# Patient Record
Sex: Male | Born: 1980 | Race: White | Hispanic: No | Marital: Married | Smoking: Never smoker
Health system: Southern US, Community
[De-identification: ages and names within clinical notes are randomized; demographics above are authoritative.]

## PROBLEM LIST (undated history)

## (undated) DIAGNOSIS — I10 Essential (primary) hypertension: Secondary | ICD-10-CM

## (undated) DIAGNOSIS — G43909 Migraine, unspecified, not intractable, without status migrainosus: Secondary | ICD-10-CM

## (undated) DIAGNOSIS — E119 Type 2 diabetes mellitus without complications: Secondary | ICD-10-CM

---
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
## 1999-01-28 ENCOUNTER — Emergency Department (HOSPITAL_COMMUNITY): Admission: EM | Admit: 1999-01-28 | Discharge: 1999-01-28 | Payer: Self-pay | Admitting: Emergency Medicine

## 2000-03-05 ENCOUNTER — Emergency Department (HOSPITAL_COMMUNITY): Admission: EM | Admit: 2000-03-05 | Discharge: 2000-03-05 | Payer: Self-pay | Admitting: Emergency Medicine

## 2000-03-07 ENCOUNTER — Emergency Department (HOSPITAL_COMMUNITY): Admission: EM | Admit: 2000-03-07 | Discharge: 2000-03-07 | Payer: Self-pay | Admitting: Emergency Medicine

## 2000-03-17 ENCOUNTER — Emergency Department (HOSPITAL_COMMUNITY): Admission: EM | Admit: 2000-03-17 | Discharge: 2000-03-17 | Payer: Self-pay

## 2000-04-11 ENCOUNTER — Emergency Department (HOSPITAL_COMMUNITY): Admission: EM | Admit: 2000-04-11 | Discharge: 2000-04-11 | Payer: Self-pay | Admitting: Emergency Medicine

## 2000-07-31 ENCOUNTER — Emergency Department (HOSPITAL_COMMUNITY): Admission: EM | Admit: 2000-07-31 | Discharge: 2000-07-31 | Payer: Self-pay

## 2000-07-31 ENCOUNTER — Encounter: Payer: Self-pay | Admitting: Emergency Medicine

## 2006-05-17 ENCOUNTER — Emergency Department (HOSPITAL_COMMUNITY): Admission: EM | Admit: 2006-05-17 | Discharge: 2006-05-17 | Payer: Self-pay | Admitting: Emergency Medicine

## 2007-05-15 IMAGING — CR DG SHOULDER 2+V*R*
3 series · 3 of 3 positions shown · non-contrast
Comparison: none

CLINICAL DATA: Multiple trauma. Right knee and right shoulder pain.
 CERVICAL SPINE WITH SWIMMERS - 6   VIEW:
 There is no evidence of cervical spine fracture or prevertebral soft tissue swelling.  Alignment is normal.  No other significant bone abnormalities are identified.

[w shoulder ap internal righ]
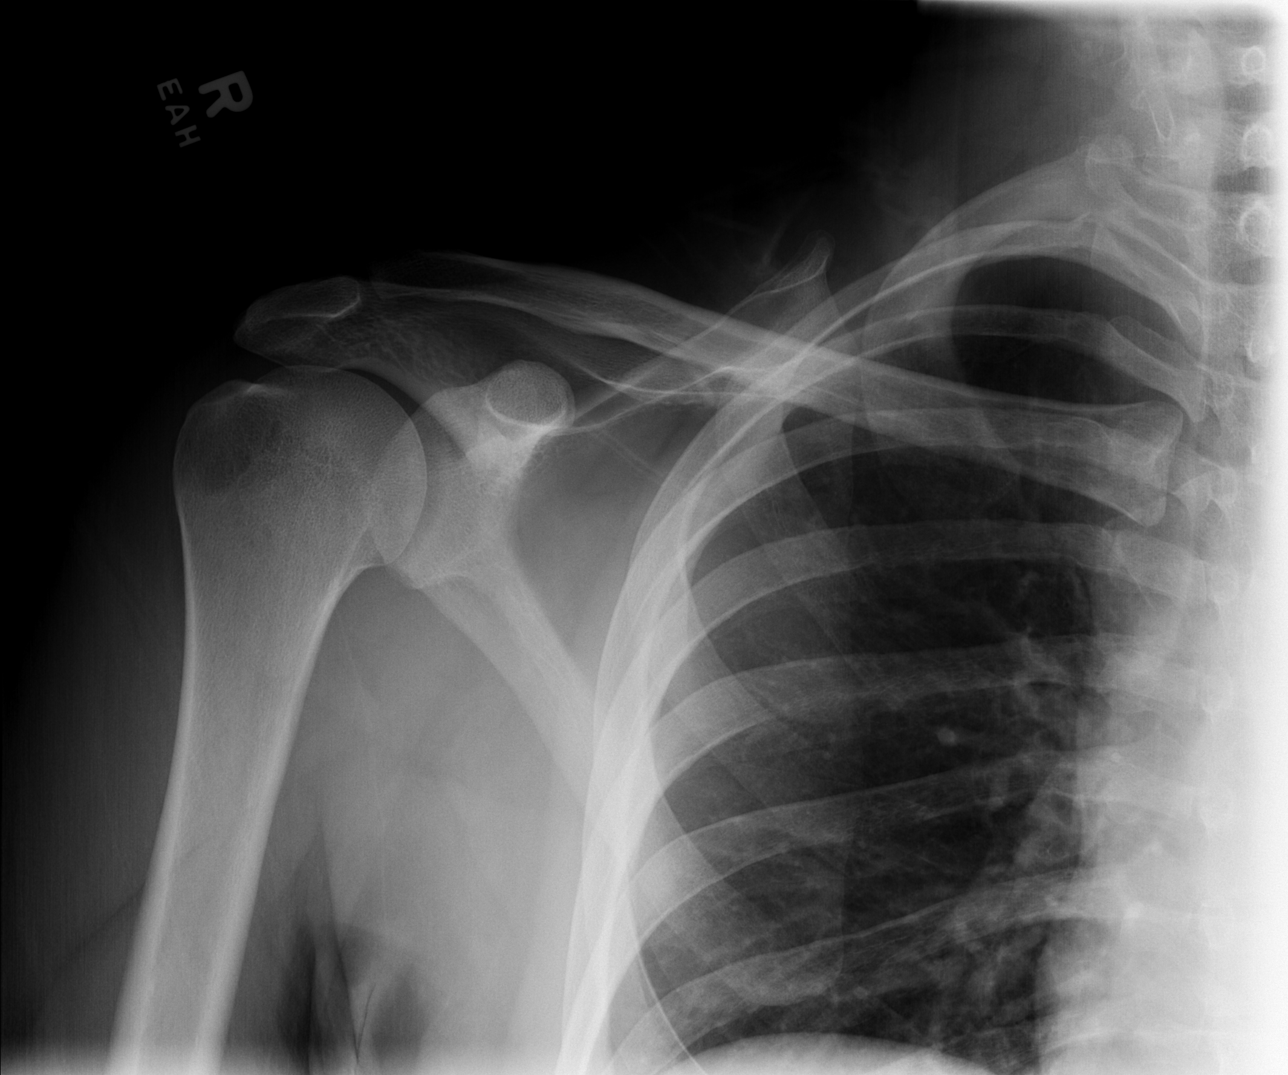

[w shoulder ap external righ]
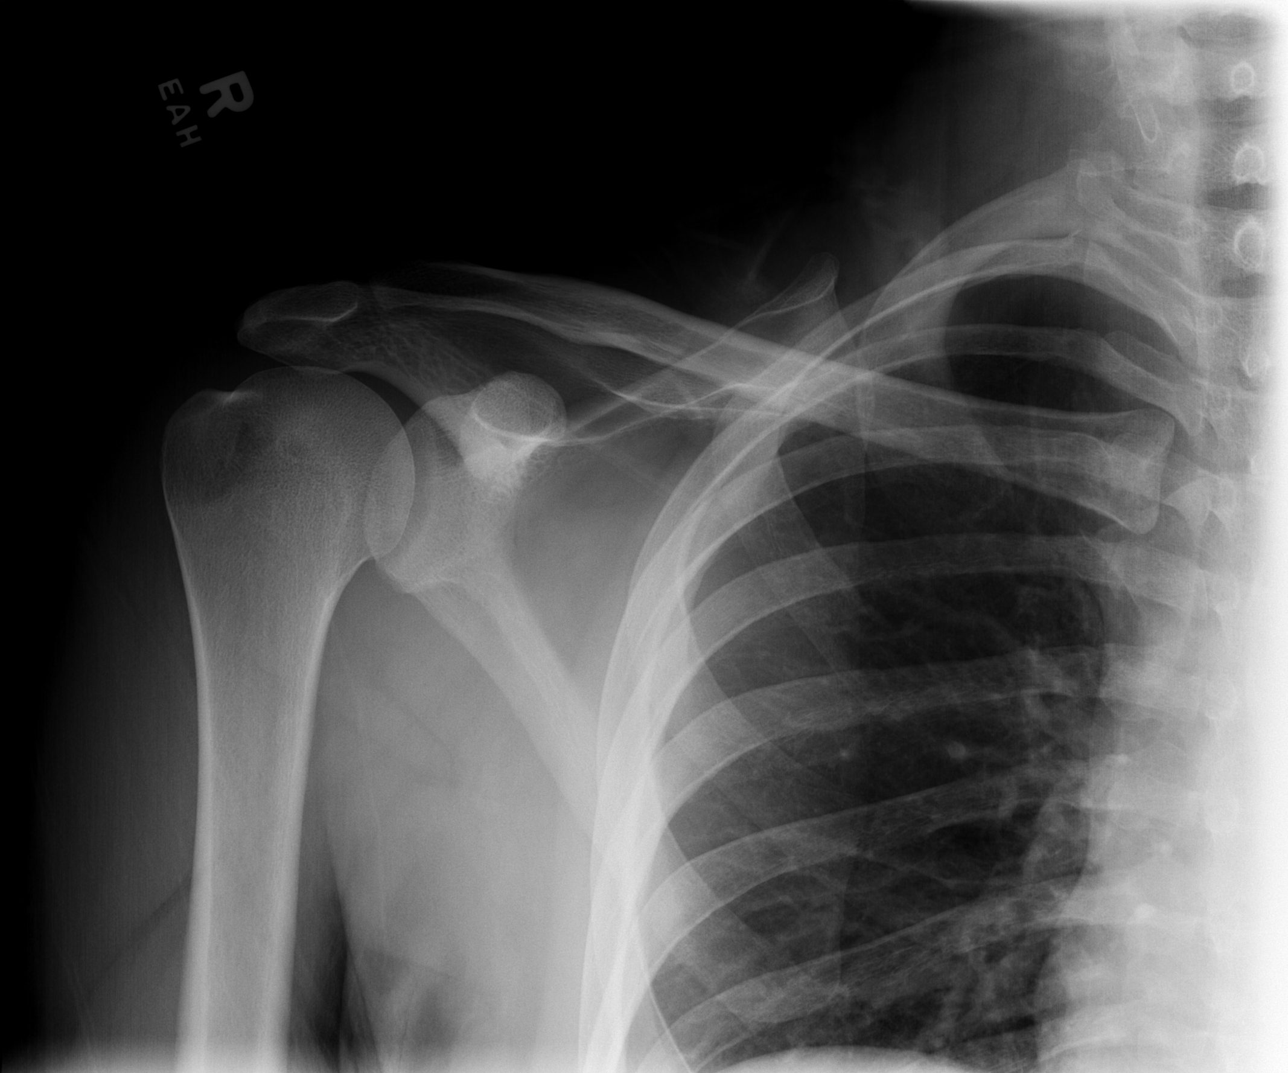

[w shoulder y view right]
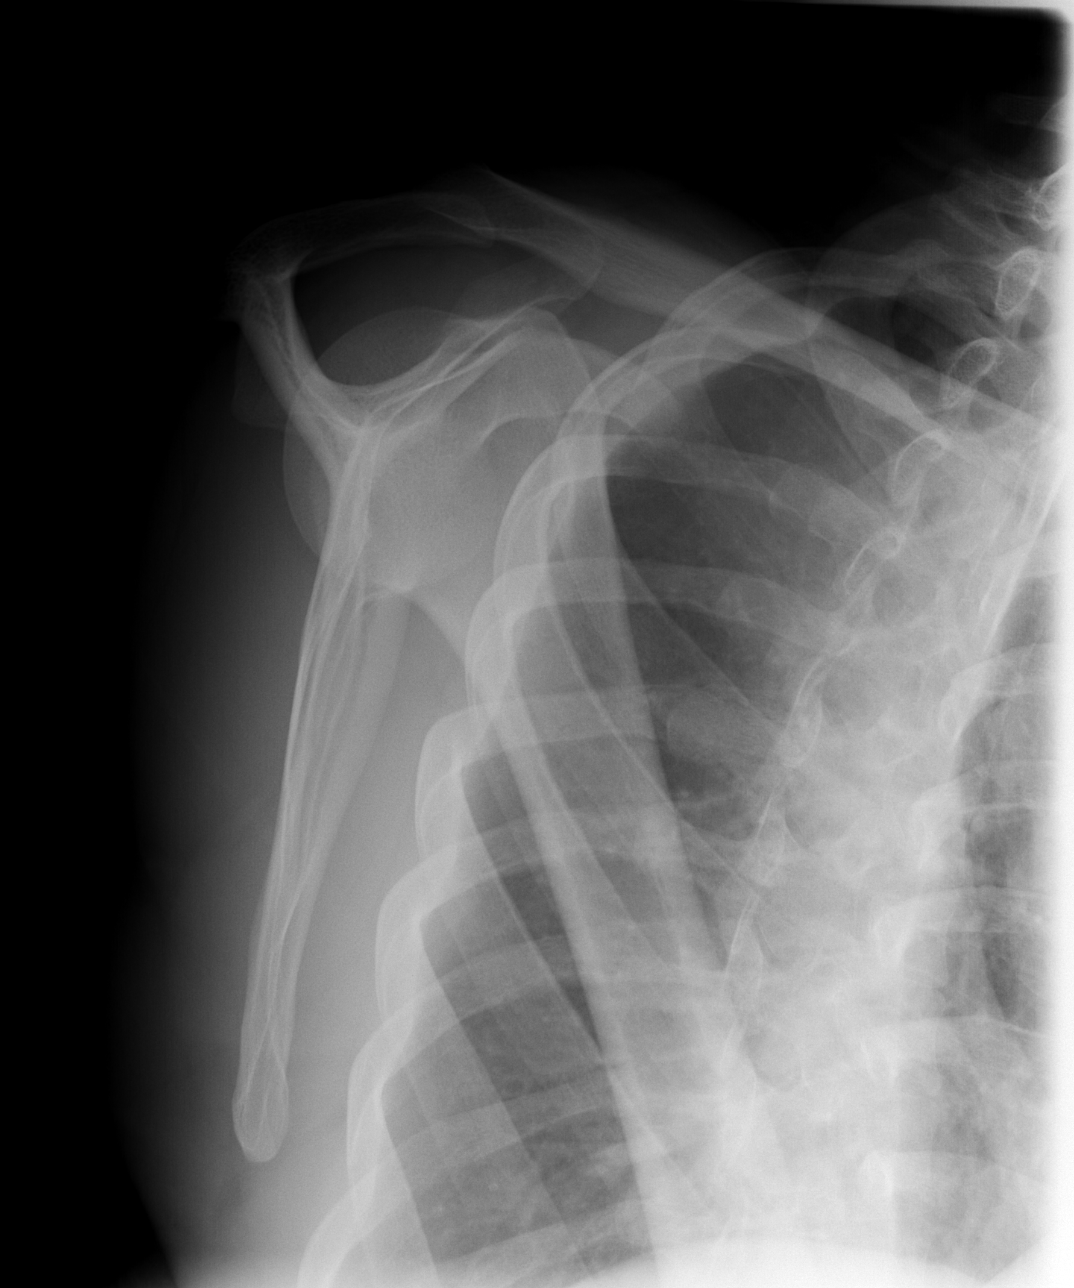

[3 of 3 positions shown; findings below may reference images not displayed]

IMPRESSION: Negative cervical spine radiographs.
 RIGHT KNEE - 4 VIEW:
 There is no evidence of fracture, dislocation, or joint effusion.  There is no evidence of arthropathy or other focal bone abnormality.  Soft tissues are unremarkable.
IMPRESSION: Negative.
 RIGHT SHOULDER - 3 VIEW:
 There is no evidence of fracture or dislocation.  There is no evidence of arthropathy or other focal bone abnormality.  Soft tissues are unremarkable.
IMPRESSION: Negative.

## 2008-07-26 ENCOUNTER — Emergency Department (HOSPITAL_COMMUNITY): Admission: EM | Admit: 2008-07-26 | Discharge: 2008-07-26 | Payer: Self-pay | Admitting: Emergency Medicine

## 2010-04-04 ENCOUNTER — Emergency Department (HOSPITAL_COMMUNITY): Admission: EM | Admit: 2010-04-04 | Discharge: 2010-04-05 | Payer: Self-pay | Admitting: Emergency Medicine

## 2010-08-01 ENCOUNTER — Ambulatory Visit: Payer: Self-pay | Admitting: Diagnostic Radiology

## 2010-08-01 ENCOUNTER — Emergency Department (HOSPITAL_BASED_OUTPATIENT_CLINIC_OR_DEPARTMENT_OTHER): Admission: EM | Admit: 2010-08-01 | Discharge: 2010-08-01 | Payer: Self-pay | Admitting: Emergency Medicine

## 2011-02-13 LAB — URINALYSIS, ROUTINE W REFLEX MICROSCOPIC
Ketones, ur: NEGATIVE mg/dL
Nitrite: NEGATIVE
Specific Gravity, Urine: 1.024 (ref 1.005–1.030)
pH: 5.5 (ref 5.0–8.0)

## 2011-02-13 LAB — COMPREHENSIVE METABOLIC PANEL
CO2: 25 mEq/L (ref 19–32)
Calcium: 8.9 mg/dL (ref 8.4–10.5)
Creatinine, Ser: 1 mg/dL (ref 0.4–1.5)
GFR calc non Af Amer: >60 mL/min (ref >60)
Glucose, Bld: 92 mg/dL (ref 70–99)
Total Protein: 7.4 g/dL (ref 6.0–8.3)

## 2011-02-13 LAB — CBC
HCT: 41.4 % (ref 39.0–52.0)
MCHC: 35.7 g/dL (ref 30.0–36.0)
MCV: 86.7 fL (ref 78.0–100.0)
Platelets: 138 10*3/uL — ABNORMAL LOW (ref 150–400)
RDW: 12.2 % (ref 11.5–15.5)

## 2011-02-13 LAB — DIFFERENTIAL
Lymphocytes Relative: 21 % (ref 12–46)
Lymphs Abs: 1.4 10*3/uL (ref 0.7–4.0)
Neutro Abs: 4.3 10*3/uL (ref 1.7–7.7)
Neutrophils Relative %: 67 % (ref 43–77)

## 2011-02-13 LAB — LIPASE, BLOOD: Lipase: 61 U/L (ref 23–300)

## 2011-05-15 ENCOUNTER — Emergency Department (INDEPENDENT_AMBULATORY_CARE_PROVIDER_SITE_OTHER): Payer: BC Managed Care – PPO

## 2011-05-15 ENCOUNTER — Emergency Department (HOSPITAL_BASED_OUTPATIENT_CLINIC_OR_DEPARTMENT_OTHER)
Admission: EM | Admit: 2011-05-15 | Discharge: 2011-05-15 | Disposition: A | Discharge status: Home / Self Care | Payer: Self-pay | Attending: Emergency Medicine | Admitting: Emergency Medicine

## 2011-05-15 DIAGNOSIS — M25579 Pain in unspecified ankle and joints of unspecified foot: Secondary | ICD-10-CM

## 2011-05-15 DIAGNOSIS — W19XXXA Unspecified fall, initial encounter: Secondary | ICD-10-CM

## 2011-05-15 DIAGNOSIS — S93409A Sprain of unspecified ligament of unspecified ankle, initial encounter: Secondary | ICD-10-CM | POA: Insufficient documentation

## 2011-05-19 ENCOUNTER — Encounter: Payer: Self-pay | Admitting: Family Medicine

## 2011-05-19 ENCOUNTER — Ambulatory Visit (INDEPENDENT_AMBULATORY_CARE_PROVIDER_SITE_OTHER): Payer: BC Managed Care – PPO | Admitting: Family Medicine

## 2011-05-19 VITALS — BP 145/82 | HR 99 | Temp 98.0°F | Ht 71.0 in | Wt 230.0 lb

## 2011-05-19 DIAGNOSIS — M25579 Pain in unspecified ankle and joints of unspecified foot: Secondary | ICD-10-CM

## 2011-05-19 DIAGNOSIS — M25571 Pain in right ankle and joints of right foot: Secondary | ICD-10-CM

## 2011-05-19 NOTE — Patient Instructions (Signed)
You have an ankle sprain. Ice the area for 15 minutes at a time, 3-4 times a day Take ibuprofen as you have been OR aleve 1-2 tabs twice a day with food for 1 week then as needed for pain, swelling. Elevate above the level of your heart when possible. Crutches if needed to help with walking. Bear weight when tolerated. Continue with laceup brace to help with stability as well. Come out of the brace twice a day to do Up/down and alphabet exercises 2-3 sets of each. Follow up with me in 2 weeks (1 1/2 if you're feeling great) for a recheck. I will mail off your FMLA paperwork.

## 2011-05-20 ENCOUNTER — Encounter: Payer: Self-pay | Admitting: Family Medicine

## 2011-05-20 DIAGNOSIS — M25571 Pain in right ankle and joints of right foot: Secondary | ICD-10-CM | POA: Insufficient documentation

## 2011-05-20 NOTE — Progress Notes (Signed)
  Subjective:    Patient ID: Dale Scott, male    DOB: 02-16-1981, 30 y.o.   MRN: [id number]  PCP: Dr. Donovan Kail  HPI 30 yo M here for right ankle injury.  Patient reports that on 6/14 he was carrying things down a set of stairs. Got to what he thought was last step (still had 1 step left though), inverted right ankle and fell to ground. + swelling but no bruising. Couldn't bear weight for first 5 minutes - pain went away but then worsened that night. Went to ED and had ankle x-rays that were negative. Given ASO and crutches which he has been using. Icing and using ibuprofen as well. Has h/o right ankle fracture 3 years ago that healed within 6 weeks and did not require surgery. 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  History reviewed. No pertinent past medical history.  No current outpatient prescriptions on file prior to visit.    History reviewed. No pertinent past surgical history.  Allergies  Allergen Reactions  . Sulfa Antibiotics     History   Social History  . Marital Status: Single    Spouse Name: N/A    Number of Children: N/A  . Years of Education: N/A   Occupational History  . Not on file.   Social History Main Topics  . Smoking status: Never Smoker   . Smokeless tobacco: Not on file  . Alcohol Use: Not on file  . Drug Use: Not on file  . Sexually Active: Not on file   Other Topics Concern  . Not on file   Social History Narrative  . No narrative on file    Family History  Problem Relation Age of Onset  . Adopted: Yes    BP 145/82  Pulse 99  Temp(Src) 98 F (36.7 C) (Oral)  Ht 5\' 11"  (1.803 m)  Wt 230 lb (104.327 kg)  BMI 32.08 kg/m2  Review of Systems See HPI above.    Objective:   Physical Exam Gen: NAD R ankle: Mod swelling throughout ankle but more laterally.  No bruising, erythema, skin breakdown. TTP at ATFL.  No TTP fibular  head, base 5th MT, navicular, posterior aspects of medial/lateral malleoli, elsewhere about foot or ankle. Mild limitation in ROM all planes. Strength 5/5 all motions. Negative syndesmotic compression, thompsons 1+ ant drawer and talar tilt (pain with tilt). NVI distally.     Assessment & Plan:  1. Right ankle sprain - initial radiographs negative and patient's pain is directly over ATFL.  Likely a grade 2 sprain that will take approximately 4 weeks to heal but in some instances can take as long as 6 weeks.  Will follow him clinically and reassess at 2 week intervals.  Start easy ROM exercises, icing, nsaids, elevation.  Use ASO for support and crutches as needed.  Weight bearing as tolerated.

## 2011-05-20 NOTE — Assessment & Plan Note (Signed)
initial radiographs negative and patient's pain is directly over ATFL. Likely a grade 2 sprain that will take approximately 4 weeks to heal but in some instances can take as long as 6 weeks. Will follow him clinically and reassess at 2 week intervals. Start easy ROM exercises, icing, nsaids, elevation. Use ASO for support and crutches as needed. Weight bearing as tolerated.

## 2011-06-02 ENCOUNTER — Encounter: Payer: Self-pay | Admitting: Family Medicine

## 2011-06-02 ENCOUNTER — Ambulatory Visit (INDEPENDENT_AMBULATORY_CARE_PROVIDER_SITE_OTHER): Payer: BC Managed Care – PPO | Admitting: Family Medicine

## 2011-06-02 VITALS — BP 134/85 | HR 78 | Temp 97.7°F | Ht 70.0 in | Wt 225.0 lb

## 2011-06-02 DIAGNOSIS — M25571 Pain in right ankle and joints of right foot: Secondary | ICD-10-CM

## 2011-06-02 DIAGNOSIS — M25579 Pain in unspecified ankle and joints of unspecified foot: Secondary | ICD-10-CM

## 2011-06-02 NOTE — Patient Instructions (Signed)
Continue to ice the area for 15 minutes at a time, 3-4 times a day as needed now Tylenol as needed for pain. Continue with laceup brace to help with stability when up and walking (in 2 weeks I'll likely only have you use this when at work). Start theraband exercises 3 sets of 10 each direction once daily, cone touches 3 sets of 15 once a day - can add a pillow under your foot if these become too easy. Follow up with me in 2 weeks for a recheck - out of work still until then.

## 2011-06-02 NOTE — Progress Notes (Signed)
  Subjective:    Patient ID: Dale Scott, male    DOB: Oct 24, 1981, 30 y.o.   MRN: [id number]  PCP: Dr. Donovan Kail  HPI  30 yo M here for 2 week f/u right ankle sprain.  6/18: Patient reports that on 6/14 he was carrying things down a set of stairs. Got to what he thought was last step (still had 1 step left though), inverted right ankle and fell to ground. + swelling but no bruising. Couldn't bear weight for first 5 minutes - pain went away but then worsened that night. Went to ED and had ankle x-rays that were negative. Given ASO and crutches which he has been using. Icing and using ibuprofen as well. Has h/o right ankle fracture 3 years ago that healed within 6 weeks and did not require surgery. 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  Today: Patient reports he is much better but still has good and bad days Using ASO when up and walking around. Icing, taking tylenol. Swelling is improved. Out of work 2/2 injury. Doing ROM and alphabet exercises.  History reviewed. No pertinent past medical history.  No current outpatient prescriptions on file prior to visit.    History reviewed. No pertinent past surgical history.  Allergies  Allergen Reactions  . Sulfa Antibiotics     History   Social History  . Marital Status: Single    Spouse Name: N/A    Number of Children: N/A  . Years of Education: N/A   Occupational History  . Not on file.   Social History Main Topics  . Smoking status: Never Smoker   . Smokeless tobacco: Not on file  . Alcohol Use: Not on file  . Drug Use: Not on file  . Sexually Active: Not on file   Other Topics Concern  . Not on file   Social History Narrative  . No narrative on file    Family History  Problem Relation Age of Onset  . Adopted: Yes    BP 134/85  Pulse 78  Temp(Src) 97.7 F (36.5 C) (Oral)  Ht 5\' 10"  (1.778 m)  Wt 225 lb  (102.059 kg)  BMI 32.28 kg/m2  Review of Systems  See HPI above.    Objective:   Physical Exam  Gen: NAD R ankle: Minimal swelling lateral ankle.  No bruising, erythema, skin breakdown. Mild TTP at ATFL.  No TTP fibular head, base 5th MT, navicular, posterior aspects of medial/lateral malleoli, elsewhere about foot or ankle. FROM all planes. Strength 5/5 all motions. Negative syndesmotic compression, thompsons 1+ ant drawer (mild pain) and trace talar tilt (no pain). NVI distally.     Assessment & Plan:  1. Right ankle sprain - patient has improved - noted laxity and pain still a 3/10.  Continue with ASO when ambulatory.  Start theraband exercises and balance exercises (demonstrated and handout provided).  Icing, tylenol as needed.  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

## 2011-06-02 NOTE — Assessment & Plan Note (Signed)
Right ankle sprain - patient has improved - noted laxity and pain still a 3/10.  Continue with ASO when ambulatory.  Start theraband exercises and balance exercises (demonstrated and handout provided).  Icing, tylenol as needed.  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

## 2011-06-16 ENCOUNTER — Encounter: Payer: Self-pay | Admitting: Family Medicine

## 2011-06-16 ENCOUNTER — Ambulatory Visit (INDEPENDENT_AMBULATORY_CARE_PROVIDER_SITE_OTHER): Payer: BC Managed Care – PPO | Admitting: Family Medicine

## 2011-06-16 VITALS — BP 135/87 | HR 74 | Ht 71.0 in | Wt 236.4 lb

## 2011-06-16 DIAGNOSIS — M25579 Pain in unspecified ankle and joints of unspecified foot: Secondary | ICD-10-CM

## 2011-06-16 DIAGNOSIS — M25571 Pain in right ankle and joints of right foot: Secondary | ICD-10-CM

## 2011-06-16 NOTE — Progress Notes (Signed)
  Subjective:    Patient ID: Dale Scott, male    DOB: 02/15/1981, 30 y.o.   MRN: [id number]  PCP: Dr. Donovan Kail  HPI  30 yo M here for 2 week f/u right ankle sprain.  6/18: Patient reports that on 6/14 he was carrying things down a set of stairs. Got to what he thought was last step (still had 1 step left though), inverted right ankle and fell to ground. + swelling but no bruising. Couldn't bear weight for first 5 minutes - pain went away but then worsened that night. Went to ED and had ankle x-rays that were negative. Given ASO and crutches which he has been using. Icing and using ibuprofen as well. Has h/o right ankle fracture 3 years ago that healed within 6 weeks and did not require surgery. 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  7/2: Patient reports he is much better but still has good and bad days Using ASO when up and walking around. Icing, taking tylenol. Swelling is improved. Out of work 2/2 injury. Doing ROM and alphabet exercises.  7/16: Patient significantly better without complaints. Still wears the ASO when walking. Not requiring icing, tylenol any longer. Ready to go back to work and feels safe doing so. Doing theraband exercises at home.  History reviewed. No pertinent past medical history.  No current outpatient prescriptions on file prior to visit.    History reviewed. No pertinent past surgical history.  Allergies  Allergen Reactions  . Sulfa Antibiotics     History   Social History  . Marital Status: Single    Spouse Name: N/A    Number of Children: N/A  . Years of Education: N/A   Occupational History  . Not on file.   Social History Main Topics  . Smoking status: Never Smoker   . Smokeless tobacco: Not on file  . Alcohol Use: Not on file  . Drug Use: Not on file  . Sexually Active: Not on file   Other Topics Concern  . Not on file     Social History Narrative  . No narrative on file    Family History  Problem Relation Age of Onset  . Adopted: Yes    BP 135/87  Pulse 74  Ht 5\' 11"  (1.803 m)  Wt 236 lb 6.4 oz (107.23 kg)  BMI 32.97 kg/m2  Review of Systems  See HPI above.    Objective:   Physical Exam  Gen: NAD R ankle: No swelling lateral ankle.  No bruising, erythema, skin breakdown. No TTP at ATFL.  No TTP fibular head, base 5th MT, navicular, posterior aspects of medial/lateral malleoli, elsewhere about foot or ankle. FROM all planes. Strength 5/5 all motions. Negative syndesmotic compression, thompsons Trace ant drawer (no pain) and trace talar tilt (no pain). NVI distally.     Assessment & Plan:  1. Right ankle sprain - patient has significantly improved - trace ant drawer and talar tilt.  Pain resolved.  Return to work though advised wearing ASO for another 2 weeks and doing home exercises for another 2 months (out to 3 months from injury).  F/u prn.

## 2011-06-16 NOTE — Assessment & Plan Note (Signed)
Right ankle sprain - patient has significantly improved - trace ant drawer and talar tilt.  Pain resolved.  Return to work though advised wearing ASO for another 2 weeks and doing home exercises for another 2 months (out to 3 months from injury).  F/u prn.

## 2012-05-12 IMAGING — CR DG ANKLE COMPLETE 3+V*R*
3 series · 3 of 3 positions shown · non-contrast
Comparison: None.

CLINICAL DATA: 29-year-old male status post fall with pain.

RIGHT ANKLE - COMPLETE 3+ VIEW

[t ankle joint ap right]
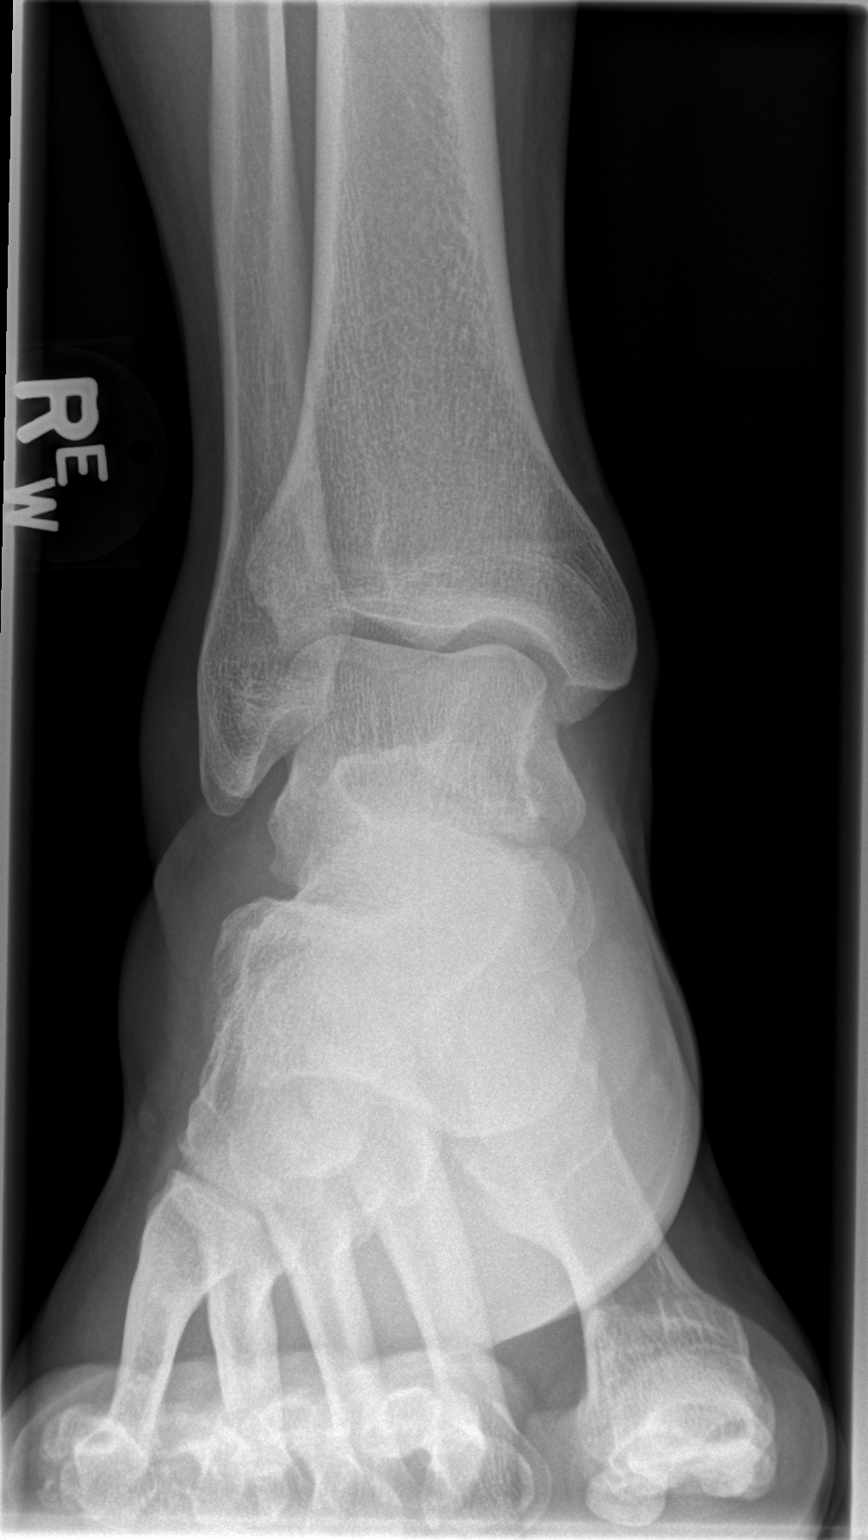

[t ankle joint oblique right]
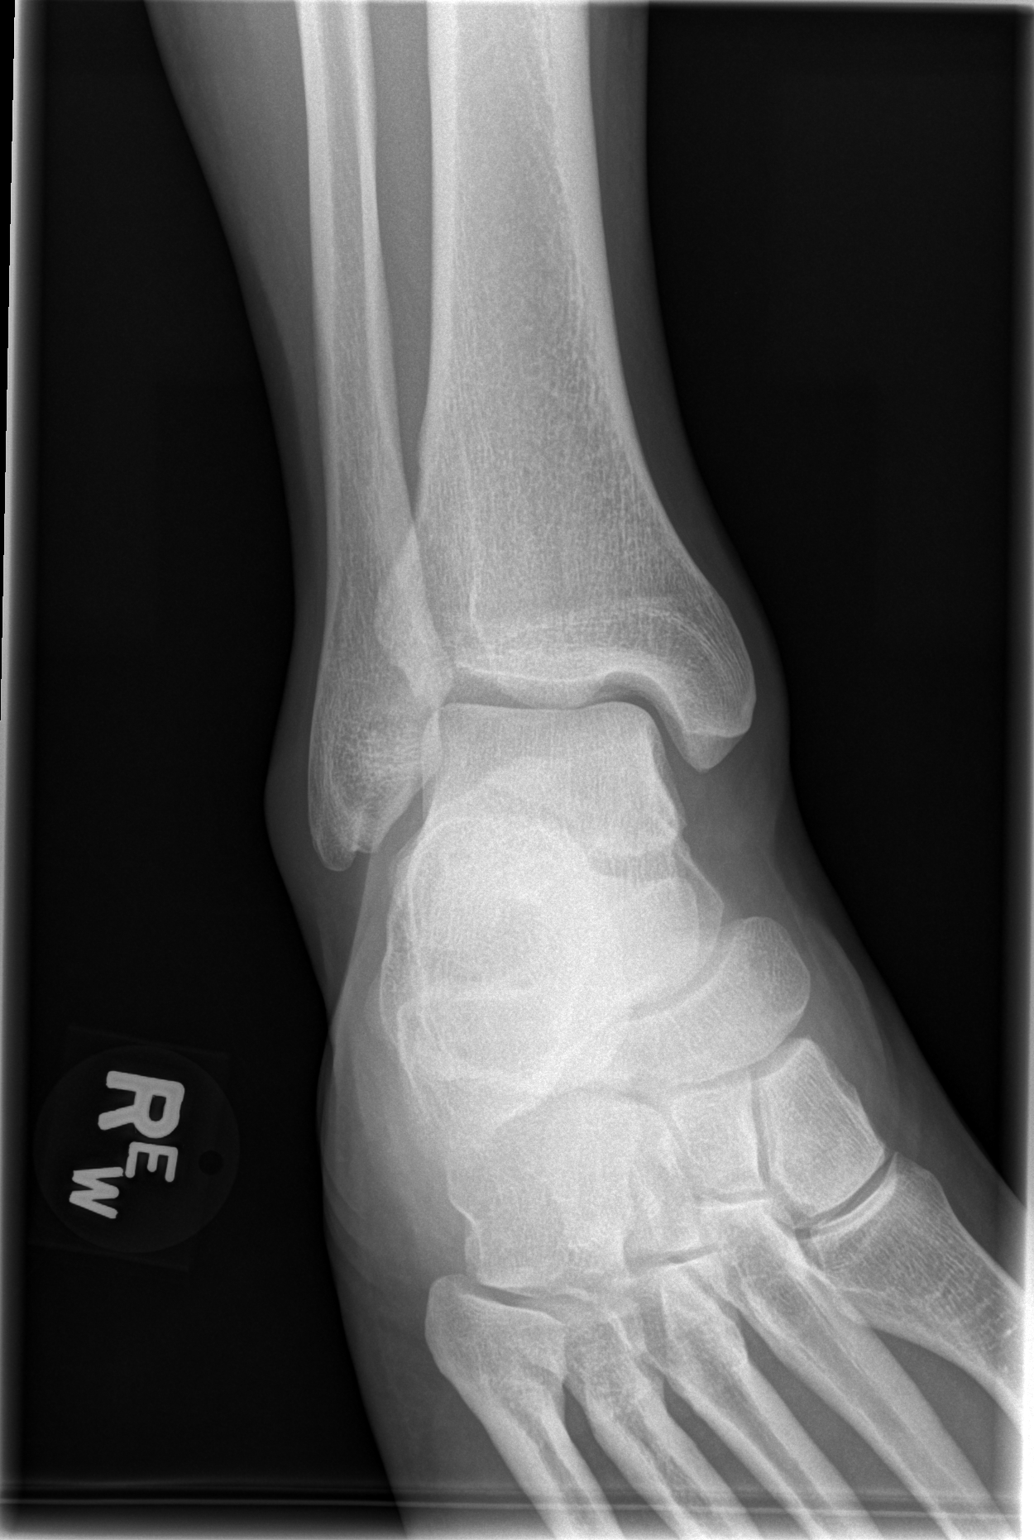

[t ankle joint lat right]
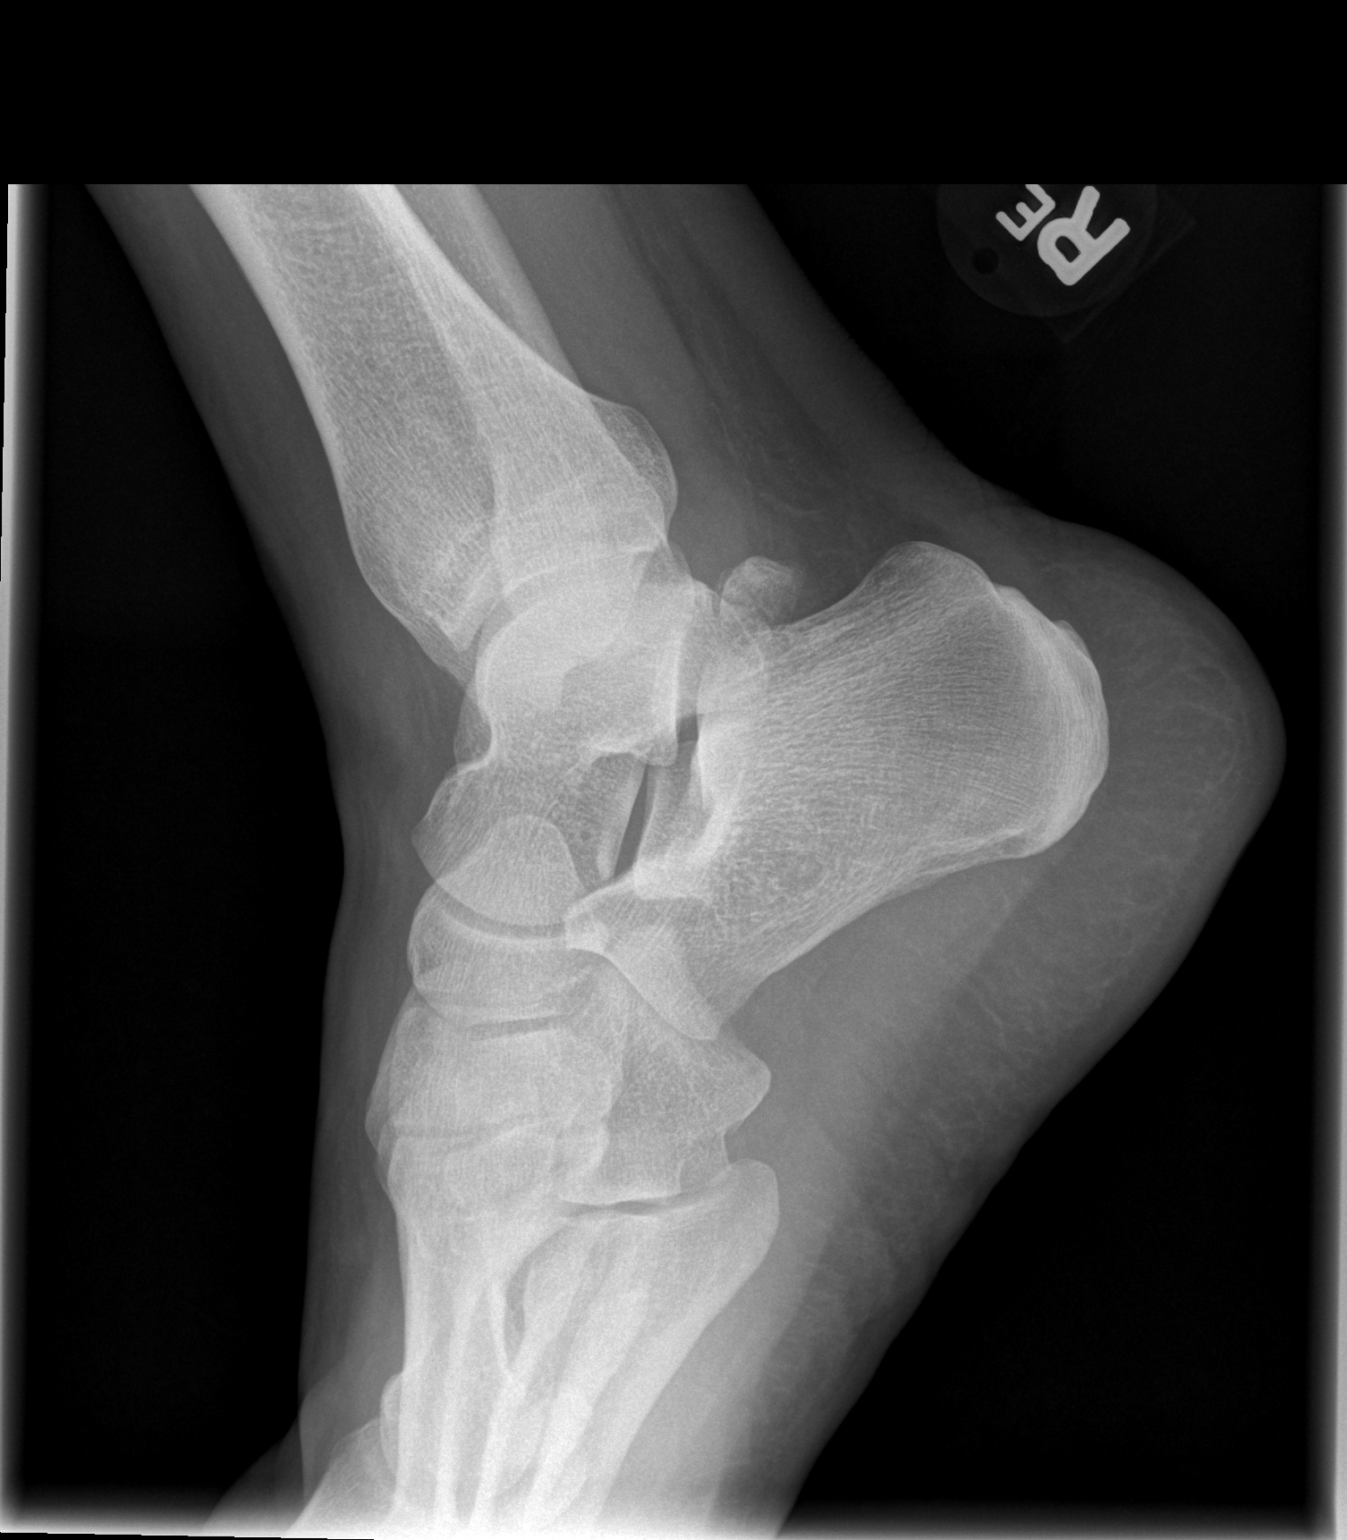

[3 of 3 positions shown; findings below may reference images not displayed]

FINDINGS: Bone mineralization is within normal limits.  Mortise
joint alignment preserved.  Talar dome intact.  Calcaneus intact.
Incidental os trigonum.  No acute fracture identified.
IMPRESSION: No acute fracture or dislocation identified about the right ankle.

## 2014-05-06 ENCOUNTER — Emergency Department (HOSPITAL_COMMUNITY)
Admission: EM | Admit: 2014-05-06 | Discharge: 2014-05-06 | Disposition: A | Discharge status: Home / Self Care | Payer: PRIVATE HEALTH INSURANCE | Attending: Emergency Medicine | Admitting: Emergency Medicine

## 2014-05-06 ENCOUNTER — Emergency Department (HOSPITAL_COMMUNITY): Payer: PRIVATE HEALTH INSURANCE

## 2014-05-06 DIAGNOSIS — R51 Headache: Secondary | ICD-10-CM | POA: Diagnosis present

## 2014-05-06 DIAGNOSIS — R748 Abnormal levels of other serum enzymes: Secondary | ICD-10-CM | POA: Insufficient documentation

## 2014-05-06 DIAGNOSIS — Z79899 Other long term (current) drug therapy: Secondary | ICD-10-CM | POA: Insufficient documentation

## 2014-05-06 DIAGNOSIS — R209 Unspecified disturbances of skin sensation: Secondary | ICD-10-CM | POA: Insufficient documentation

## 2014-05-06 DIAGNOSIS — G43109 Migraine with aura, not intractable, without status migrainosus: Secondary | ICD-10-CM | POA: Diagnosis not present

## 2014-05-06 LAB — COMPREHENSIVE METABOLIC PANEL
ALK PHOS: 53 U/L (ref 39–117)
ALT: 111 U/L — AB (ref 0–53)
AST: 74 U/L — ABNORMAL HIGH (ref 0–37)
Albumin: 4.2 g/dL (ref 3.5–5.2)
BILIRUBIN TOTAL: 0.7 mg/dL (ref 0.3–1.2)
BUN: 14 mg/dL (ref 6–23)
CO2: 25 meq/L (ref 19–32)
Calcium: 9.7 mg/dL (ref 8.4–10.5)
Chloride: 99 mEq/L (ref 96–112)
Creatinine, Ser: 0.96 mg/dL (ref 0.50–1.35)
GLUCOSE: 107 mg/dL — AB (ref 70–99)
POTASSIUM: 3.8 meq/L (ref 3.7–5.3)
SODIUM: 137 meq/L (ref 137–147)
TOTAL PROTEIN: 7.5 g/dL (ref 6.0–8.3)

## 2014-05-06 LAB — DIFFERENTIAL
Basophils Absolute: 0 10*3/uL (ref 0.0–0.1)
Basophils Relative: 0 % (ref 0–1)
EOS ABS: 0.1 10*3/uL (ref 0.0–0.7)
Eosinophils Relative: 1 % (ref 0–5)
LYMPHS ABS: 2.3 10*3/uL (ref 0.7–4.0)
LYMPHS PCT: 26 % (ref 12–46)
MONOS PCT: 8 % (ref 3–12)
Monocytes Absolute: 0.7 10*3/uL (ref 0.1–1.0)
NEUTROS PCT: 65 % (ref 43–77)
Neutro Abs: 5.7 10*3/uL (ref 1.7–7.7)

## 2014-05-06 LAB — CBC
HCT: 37.8 % — ABNORMAL LOW (ref 39.0–52.0)
HEMOGLOBIN: 13.8 g/dL (ref 13.0–17.0)
MCH: 30.2 pg (ref 26.0–34.0)
MCHC: 36.5 g/dL — ABNORMAL HIGH (ref 30.0–36.0)
MCV: 82.7 fL (ref 78.0–100.0)
PLATELETS: 171 10*3/uL (ref 150–400)
RBC: 4.57 MIL/uL (ref 4.22–5.81)
RDW: 13 % (ref 11.5–15.5)
WBC: 8.7 10*3/uL (ref 4.0–10.5)

## 2014-05-06 LAB — APTT: aPTT: 28 seconds (ref 24–37)

## 2014-05-06 LAB — I-STAT TROPONIN, ED: TROPONIN I, POC: 0 ng/mL (ref 0.00–0.08)

## 2014-05-06 LAB — PROTIME-INR
INR: 1.04 (ref 0.00–1.49)
Prothrombin Time: 13.4 seconds (ref 11.6–15.2)

## 2014-05-06 LAB — ETHANOL: Alcohol, Ethyl (B): <11 mg/dL (ref 0–11)

## 2014-05-06 MED ORDER — PROCHLORPERAZINE EDISYLATE 5 MG/ML IJ SOLN
10.0000 mg | Freq: Once | INTRAMUSCULAR | Status: AC
  Administered 2014-05-06: 10 mg via INTRAVENOUS
  Filled 2014-05-06: qty 2

## 2014-05-06 MED ORDER — DIPHENHYDRAMINE HCL 50 MG/ML IJ SOLN
12.5000 mg | Freq: Once | INTRAMUSCULAR | Status: AC
  Administered 2014-05-06: 12.5 mg via INTRAVENOUS
  Filled 2014-05-06: qty 1

## 2014-05-06 NOTE — ED Notes (Signed)
To ct

## 2014-05-06 NOTE — ED Notes (Signed)
His headache is better

## 2014-05-06 NOTE — ED Notes (Addendum)
Pt states he began having Headache, numbness to L cheek, and bilateral grey peripheral fields vision.  4mg  Zofran and 4 Morphine.  18G L AC.

## 2014-05-06 NOTE — ED Provider Notes (Signed)
CSN: [id number]     Arrival date & time 05/06/14  1841 History   First MD Initiated Contact with Patient 05/06/14 1852     Chief Complaint  Patient presents with  . Headache  . Numbness  . Eye Problem     HPI Pt woke up today with a slight headache but also didn't feel exactly well.  This started at 0930.  He went to sleep and woke up about two hours ago. He took his blood pressure and it was elevated.  He has noticed the the periphery of his vision is gray.  His left hand feels fatigued and tired and he has noticed trouble with his speech.  He is having difficulty getting words out and feels like he has a slight stutter which is new. Last time known normal was noon.  He has migraine but has not had this type of trouble before and the headache is not like his usual headache.  His symptoms are getting better slightly.  He was given morphine and zofran by EMS. No past medical history on file. No past surgical history on file. Family History  Problem Relation Age of Onset  . Adopted: Yes   History  Substance Use Topics  . Smoking status: Never Smoker   . Smokeless tobacco: Not on file  . Alcohol Use: Not on file    Review of Systems  All other systems reviewed and are negative.     Allergies  Sulfa antibiotics  Home Medications   Prior to Admission medications   Medication Sig Start Date End Date Taking? Authorizing Provider  amphetamine-dextroamphetamine (ADDERALL XR) 20 MG 24 hr capsule Take 20 mg by mouth daily.   Yes Historical Provider, MD  aspirin-acetaminophen-caffeine (EXCEDRIN MIGRAINE) [phone] MG per tablet Take 2 tablets by mouth every 6 (six) hours as needed for headache.   Yes Historical Provider, MD  gabapentin (NEURONTIN) 100 MG capsule Take 100 mg by mouth daily as needed (restless leg).   Yes Historical Provider, MD  sertraline (ZOLOFT) 50 MG tablet Take 50 mg by mouth daily.   Yes Historical Provider, MD   BP 149/86  Pulse 77  Temp(Src) 98.2 F (36.8  C) (Oral)  Resp 15  SpO2 98% Physical Exam  Nursing note and vitals reviewed. Constitutional: He is oriented to person, place, and time. He appears well-developed and well-nourished. No distress.  HENT:  Head: Normocephalic and atraumatic.  Right Ear: External ear normal.  Left Ear: External ear normal.  Mouth/Throat: Oropharynx is clear and moist.  Eyes: Conjunctivae are normal. Right eye exhibits no discharge. Left eye exhibits no discharge. No scleral icterus.  Neck: Neck supple. No tracheal deviation present.  Cardiovascular: Normal rate, regular rhythm and intact distal pulses.   Pulmonary/Chest: Effort normal and breath sounds normal. No stridor. No respiratory distress. He has no wheezes. He has no rales.  Abdominal: Soft. Bowel sounds are normal. He exhibits no distension. There is no tenderness. There is no rebound and no guarding.  Musculoskeletal: He exhibits no edema and no tenderness.  Neurological: He is alert and oriented to person, place, and time. He has normal strength. No cranial nerve deficit (No facial droop, extraocular movements intact, tongue midline  , stuttering speech pattern) or sensory deficit. He exhibits normal muscle tone. He displays no seizure activity. Coordination normal.  No pronator drift bilateral upper extrem, able to hold both legs off bed for 5 seconds, sensation intact in all extremities, no visual field cuts, no left or right  sided neglect, normal finger-nose exam bilaterally, no nystagmus noted   Skin: Skin is warm and dry. No rash noted.  Psychiatric: He has a normal mood and affect.    ED Course  Procedures (including critical care time) Telemetry strip: Sinus rhythm, narrow qrs, rate 88 Labs Review Labs Reviewed  CBC - Abnormal; Notable for the following:    HCT 37.8 (*)    MCHC 36.5 (*)    All other components within normal limits  COMPREHENSIVE METABOLIC PANEL - Abnormal; Notable for the following:    Glucose, Bld 107 (*)    AST 74  (*)    ALT 111 (*)    All other components within normal limits  PROTIME-INR  APTT  DIFFERENTIAL  ETHANOL  URINE RAPID DRUG SCREEN (HOSP PERFORMED)  URINALYSIS, ROUTINE W REFLEX MICROSCOPIC  I-STAT TROPOININ, ED    Imaging Review Ct Head Wo Contrast  05/06/2014   CLINICAL DATA:  Slurred speech and visual disturbance  EXAM: CT HEAD WITHOUT CONTRAST  TECHNIQUE: Contiguous axial images were obtained from the base of the skull through the vertex without intravenous contrast.  COMPARISON:  None.  FINDINGS: The ventricles are normal in size and configuration. There is no mass, hemorrhage, extra-axial fluid collection, or midline shift. The gray-white compartments appear normal. There is no demonstrable acute infarct. The bony calvarium appears intact. The mastoid air cells are clear. There is mild left ethmoid sinus disease.  IMPRESSION: Mild left ethmoid sinus disease. No intracranial mass, hemorrhage, or acute appearing infarct.   Electronically Signed   By: Bretta BangWilliam  Woodruff M.D.   On: 05/06/2014 19:55    MDM   Final diagnoses:  Migraine headache with aura  Elevated liver enzymes    2112  the patient's symptoms have all resolved. He is feeling much better. I suspect the patient has had a complex migraine headache. As his headache has resolved so has the stuttering.  We discussed the possibility of TIA but overall very low clinical suspicion. Patient will follow up with his primary care Dr. I did discuss the elevated liver function tests with him. He has been told in the past. He will review those results with his doctor   Linwood DibblesJon Chaska Hagger, MD 05/06/14 2114

## 2014-05-06 NOTE — ED Notes (Signed)
Pt returned from c-t med given for his headache

## 2014-05-06 NOTE — ED Notes (Signed)
The pts headache is a little better.  He passed his swallow screen

## 2015-09-27 ENCOUNTER — Encounter (HOSPITAL_COMMUNITY): Payer: Self-pay | Admitting: Emergency Medicine

## 2015-09-27 ENCOUNTER — Emergency Department (HOSPITAL_COMMUNITY)
Admission: EM | Admit: 2015-09-27 | Discharge: 2015-09-27 | Disposition: A | Discharge status: Home / Self Care | Payer: BLUE CROSS/BLUE SHIELD | Attending: Emergency Medicine | Admitting: Emergency Medicine

## 2015-09-27 DIAGNOSIS — Z79899 Other long term (current) drug therapy: Secondary | ICD-10-CM | POA: Insufficient documentation

## 2015-09-27 DIAGNOSIS — E1165 Type 2 diabetes mellitus with hyperglycemia: Secondary | ICD-10-CM | POA: Diagnosis not present

## 2015-09-27 DIAGNOSIS — E139 Other specified diabetes mellitus without complications: Secondary | ICD-10-CM

## 2015-09-27 DIAGNOSIS — I10 Essential (primary) hypertension: Secondary | ICD-10-CM | POA: Diagnosis not present

## 2015-09-27 DIAGNOSIS — R739 Hyperglycemia, unspecified: Secondary | ICD-10-CM

## 2015-09-27 HISTORY — DX: Essential (primary) hypertension: I10

## 2015-09-27 LAB — URINALYSIS, ROUTINE W REFLEX MICROSCOPIC
BILIRUBIN URINE: NEGATIVE
Hgb urine dipstick: NEGATIVE
KETONES UR: NEGATIVE mg/dL
Leukocytes, UA: NEGATIVE
Nitrite: NEGATIVE
Protein, ur: NEGATIVE mg/dL
Specific Gravity, Urine: 1.04 — ABNORMAL HIGH (ref 1.005–1.030)
Urobilinogen, UA: 0.2 mg/dL (ref 0.0–1.0)
pH: 5 (ref 5.0–8.0)

## 2015-09-27 LAB — CBC
HCT: 41.2 % (ref 39.0–52.0)
Hemoglobin: 15.2 g/dL (ref 13.0–17.0)
MCH: 30.2 pg (ref 26.0–34.0)
MCHC: 36.9 g/dL — ABNORMAL HIGH (ref 30.0–36.0)
MCV: 81.9 fL (ref 78.0–100.0)
PLATELETS: 192 10*3/uL (ref 150–400)
RBC: 5.03 MIL/uL (ref 4.22–5.81)
RDW: 13 % (ref 11.5–15.5)
WBC: 9.6 10*3/uL (ref 4.0–10.5)

## 2015-09-27 LAB — BASIC METABOLIC PANEL
Anion gap: 11 (ref 5–15)
BUN: 19 mg/dL (ref 6–20)
CO2: 25 mmol/L (ref 22–32)
CREATININE: 1.04 mg/dL (ref 0.61–1.24)
Calcium: 9.6 mg/dL (ref 8.9–10.3)
Chloride: 97 mmol/L — ABNORMAL LOW (ref 101–111)
GFR calc Af Amer: >60 mL/min (ref >60)
GFR calc non Af Amer: >60 mL/min (ref >60)
GLUCOSE: 361 mg/dL — AB (ref 65–99)
Potassium: 3.6 mmol/L (ref 3.5–5.1)
Sodium: 133 mmol/L — ABNORMAL LOW (ref 135–145)

## 2015-09-27 LAB — CBG MONITORING, ED
Glucose-Capillary: 373 mg/dL — ABNORMAL HIGH (ref 65–99)
Glucose-Capillary: 288 mg/dL — ABNORMAL HIGH (ref 65–99)

## 2015-09-27 LAB — URINE MICROSCOPIC-ADD ON

## 2015-09-27 MED ORDER — METFORMIN HCL 500 MG PO TABS
500.0000 mg | ORAL_TABLET | Freq: Once | ORAL | Status: AC
  Administered 2015-09-27: 500 mg via ORAL
  Filled 2015-09-27: qty 1

## 2015-09-27 MED ORDER — SODIUM CHLORIDE 0.9 % IV SOLN
1000.0000 mL | Freq: Once | INTRAVENOUS | Status: AC
  Administered 2015-09-27: 1000 mL via INTRAVENOUS

## 2015-09-27 MED ORDER — DEXTROSE-NACL 5-0.45 % IV SOLN: INTRAVENOUS | Status: DC

## 2015-09-27 MED ORDER — SODIUM CHLORIDE 0.9 % IV SOLN
INTRAVENOUS | Status: DC
  Filled 2015-09-27: qty 2.5

## 2015-09-27 MED ORDER — METFORMIN HCL 500 MG PO TABS: 500.0000 mg | ORAL_TABLET | Freq: Two times a day (BID) | ORAL | Status: DC

## 2015-09-27 MED ORDER — SODIUM CHLORIDE 0.9 % IV SOLN
1000.0000 mL | INTRAVENOUS | Status: DC
  Administered 2015-09-27: 1000 mL via INTRAVENOUS

## 2015-09-27 NOTE — ED Notes (Signed)
Follow up care, discharge instructions, and rx x1 reviewed with patient. Patient verbalized understanding.

## 2015-09-27 NOTE — ED Notes (Addendum)
Patient c/o polydipsia, polyurea, weakness, dizziness x 3 weeks. Pt was diaphoretic today and checked CBG 1.5 hours ago, noted CBG to be 567. Pt has no hx of DM or glycemic control issues, no use of steroids. CBG 373 currently, pt states he drank large amounts of water to decrease blood sugar.

## 2015-09-27 NOTE — Discharge Instructions (Signed)
You have been diagnosed with diabetes, likely type 2 diabetes.  Take metformin twice daily as prescribed.  Follow up promptly with your primary care provider for further management of your condition.  Return to ER if you have any concerns.    Hyperglycemia Hyperglycemia occurs when the glucose (sugar) in your blood is too high. Hyperglycemia can happen for many reasons, but it most often happens to people who do not know they have diabetes or are not managing their diabetes properly.  CAUSES  Whether you have diabetes or not, there are other causes of hyperglycemia. Hyperglycemia can occur when you have diabetes, but it can also occur in other situations that you might not be as aware of, such as: Diabetes  If you have diabetes and are having problems controlling your blood glucose, hyperglycemia could occur because of some of the following reasons:  Not following your meal plan.  Not taking your diabetes medications or not taking it properly.  Exercising less or doing less activity than you normally do.  Being sick. Pre-diabetes  This cannot be ignored. Before people develop Type 2 diabetes, they almost always have "pre-diabetes." This is when your blood glucose levels are higher than normal, but not yet high enough to be diagnosed as diabetes. Research has shown that some long-term damage to the body, especially the heart and circulatory system, may already be occurring during pre-diabetes. If you take action to manage your blood glucose when you have pre-diabetes, you may delay or prevent Type 2 diabetes from developing. Stress  If you have diabetes, you may be "diet" controlled or on oral medications or insulin to control your diabetes. However, you may find that your blood glucose is higher than usual in the hospital whether you have diabetes or not. This is often referred to as "stress hyperglycemia." Stress can elevate your blood glucose. This happens because of hormones put out by the  body during times of stress. If stress has been the cause of your high blood glucose, it can be followed regularly by your caregiver. That way he/she can make sure your hyperglycemia does not continue to get worse or progress to diabetes. Steroids  Steroids are medications that act on the infection fighting system (immune system) to block inflammation or infection. One side effect can be a rise in blood glucose. Most people can produce enough extra insulin to allow for this rise, but for those who cannot, steroids make blood glucose levels go even higher. It is not unusual for steroid treatments to "uncover" diabetes that is developing. It is not always possible to determine if the hyperglycemia will go away after the steroids are stopped. A special blood test called an A1c is sometimes done to determine if your blood glucose was elevated before the steroids were started. SYMPTOMS  Thirsty.  Frequent urination.  Dry mouth.  Blurred vision.  Tired or fatigue.  Weakness.  Sleepy.  Tingling in feet or leg. DIAGNOSIS  Diagnosis is made by monitoring blood glucose in one or all of the following ways:  A1c test. This is a chemical found in your blood.  Fingerstick blood glucose monitoring.  Laboratory results. TREATMENT  First, knowing the cause of the hyperglycemia is important before the hyperglycemia can be treated. Treatment may include, but is not be limited to:  Education.  Change or adjustment in medications.  Change or adjustment in meal plan.  Treatment for an illness, infection, etc.  More frequent blood glucose monitoring.  Change in exercise plan.  Decreasing or stopping steroids.  Lifestyle changes. HOME CARE INSTRUCTIONS   Test your blood glucose as directed.  Exercise regularly. Your caregiver will give you instructions about exercise. Pre-diabetes or diabetes which comes on with stress is helped by exercising.  Eat wholesome, balanced meals. Eat often  and at regular, fixed times. Your caregiver or nutritionist will give you a meal plan to guide your sugar intake.  Being at an ideal weight is important. If needed, losing as little as 10 to 15 pounds may help improve blood glucose levels. SEEK MEDICAL CARE IF:   You have questions about medicine, activity, or diet.  You continue to have symptoms (problems such as increased thirst, urination, or weight gain). SEEK IMMEDIATE MEDICAL CARE IF:   You are vomiting or have diarrhea.  Your breath smells fruity.  You are breathing faster or slower.  You are very sleepy or incoherent.  You have numbness, tingling, or pain in your feet or hands.  You have chest pain.  Your symptoms get worse even though you have been following your caregiver's orders.  If you have any other questions or concerns.   This information is not intended to replace advice given to you by your health care provider. Make sure you discuss any questions you have with your health care provider.   Document Released: 05/13/2001 Document Revised: 02/09/2012 Document Reviewed: 07/24/2015 [employer] Interactive Patient Education [employer]2016 [employer]

## 2015-09-27 NOTE — ED Notes (Signed)
CBG 288. Dr. Clayborne DanaMesner made aware. Was given verbal orders to not start the insulin drip.

## 2015-09-27 NOTE — ED Provider Notes (Signed)
Medical screening examination/treatment/procedure(s) were conducted as a shared visit with non-physician practitioner(s) and myself.  I personally evaluated the patient during the encounter.  New onset DM, >500 today before arriving. On exam, VS WNL, abdomen benign, cardiac exam withotu tachycardia or murmurs, lung exam without tachypnea, wheezing or crackles.  Plan to improve blood sugar, start on metformin and have PCP follow up. No infectious symptoms to suggest need for further workup.   Marily MemosJason Codie Krogh, MD 09/28/15 1116

## 2015-09-27 NOTE — ED Provider Notes (Signed)
CSN: [id number]     Arrival date & time 09/27/15  1442 History   First MD Initiated Contact with Patient 09/27/15 1456     Chief Complaint  Patient presents with  . Hyperglycemia     (Consider location/radiation/quality/duration/timing/severity/associated sxs/prior Treatment) HPI   34 year old male without prior hx of diabetes presents to ER with c/o generalized weakness. Pt report polydipsia, polyuria, generalized weakness and dizziness for the past 3 weeks. 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  He decided to check his CBG and it was 567.  His boss recommend pt to come to ER for further evaluation.  Pt report urinating twice hourly for the past weeks.  He drank large amount of water.  He feel sleepy most of the time.  He is adopted therefore does not know his family medical history.  He report having a normal A1c 5 years ago.  He denies any recent sickness, increasing stress, change in diet or having any physical pain.  Does have a PCP, last seen 1 year ago.  No other complaints.    Past Medical History  Diagnosis Date  . Hypertension    History reviewed. No pertinent past surgical history. Family History  Problem Relation Age of Onset  . Adopted: Yes   Social History  Substance Use Topics  . Smoking status: Never Smoker   . Smokeless tobacco: None  . Alcohol Use: None    Review of Systems  All other systems reviewed and are negative.     Allergies  Sulfa antibiotics  Home Medications   Prior to Admission medications   Medication Sig Start Date End Date Taking? Authorizing Provider  amphetamine-dextroamphetamine (ADDERALL XR) 20 MG 24 hr capsule Take 20 mg by mouth daily.    Historical Provider, MD  aspirin-acetaminophen-caffeine (EXCEDRIN MIGRAINE) [phone] MG per tablet Take 2 tablets by mouth every 6 (six) hours as needed for headache.    Historical Provider, MD  gabapentin (NEURONTIN) 100 MG capsule Take 100 mg by mouth daily  as needed (restless leg).    Historical Provider, MD  sertraline (ZOLOFT) 50 MG tablet Take 50 mg by mouth daily.    Historical Provider, MD   BP 146/106 mmHg  Pulse 102  Temp(Src) 98 F (36.7 C) (Oral)  Resp 18  SpO2 99% Physical Exam  Constitutional: He appears well-developed and well-nourished. No distress.  Awake, alert, nontoxic appearance  HENT:  Head: Atraumatic.  Eyes: Conjunctivae are normal. Right eye exhibits no discharge. Left eye exhibits no discharge.  Neck: Normal range of motion. Neck supple.  Cardiovascular: Normal rate and regular rhythm.   Pulmonary/Chest: Effort normal. No respiratory distress. He exhibits no tenderness.  Abdominal: Soft. There is no tenderness. There is no rebound.  Musculoskeletal: He exhibits no tenderness.  ROM appears intact, no obvious focal weakness  Neurological: He is alert.  Skin: Skin is warm and dry. No rash noted.  Psychiatric: He has a normal mood and affect.  Nursing note and vitals reviewed.   ED Course  Procedures (including critical care time)  Pt with sxs consistent with diabetes, undiagnosed.  No triggers factor.  No sxs to suggest infection.  He is mentating appropriately.  Work up initiated.    3:55 PM CBG currently in the 300s.  No anion gap to suggest DKA.  Pt does have a PCP.  Will focus on improving his CBG with glucostabilizer.  Pt likely stable for discharge to f/u with PCP for  further management.  Will d/c with metformin.  Care discussed with Dr. Clayborne Dana.    4:33 PM CBG improves after IVF.  CBG is 288 without receiving insulin.  Pt stable for discharge.  Will d/c with metformin, return precaution discussed.    Labs Review Labs Reviewed  BASIC METABOLIC PANEL - Abnormal; Notable for the following:    Sodium 133 (*)    Chloride 97 (*)    Glucose, Bld 361 (*)    All other components within normal limits  CBC - Abnormal; Notable for the following:    MCHC 36.9 (*)    All other components within normal limits   URINALYSIS, ROUTINE W REFLEX MICROSCOPIC (NOT AT [facility]) - Abnormal; Notable for the following:    Specific Gravity, Urine 1.040 (*)    Glucose, UA >1000 (*)    All other components within normal limits  CBG MONITORING, ED - Abnormal; Notable for the following:    Glucose-Capillary 373 (*)    All other components within normal limits  CBG MONITORING, ED - Abnormal; Notable for the following:    Glucose-Capillary 288 (*)    All other components within normal limits  URINE MICROSCOPIC-ADD ON    Imaging Review No results found. I have personally reviewed and evaluated these images and lab results as part of my medical decision-making.   EKG Interpretation None      MDM   Final diagnoses:  Hyperglycemia  Diabetes mellitus of other type without complication (HCC)    BP 145/93 mmHg  Pulse 88  Temp(Src) 98 F (36.7 C) (Oral)  Resp 18  Ht  (1.803 m)  Wt 240 lb (108.863 kg)  BMI 33.49 kg/m2  SpO2 96%     Fayrene Helper, PA-C 09/27/15 1643  Marily Memos, MD 09/28/15 1116

## 2016-02-26 ENCOUNTER — Encounter (HOSPITAL_BASED_OUTPATIENT_CLINIC_OR_DEPARTMENT_OTHER): Payer: Self-pay | Admitting: *Deleted

## 2016-02-26 DIAGNOSIS — E119 Type 2 diabetes mellitus without complications: Secondary | ICD-10-CM | POA: Insufficient documentation

## 2016-02-26 DIAGNOSIS — K029 Dental caries, unspecified: Secondary | ICD-10-CM | POA: Insufficient documentation

## 2016-02-26 DIAGNOSIS — Z79899 Other long term (current) drug therapy: Secondary | ICD-10-CM | POA: Insufficient documentation

## 2016-02-26 DIAGNOSIS — K0889 Other specified disorders of teeth and supporting structures: Secondary | ICD-10-CM | POA: Diagnosis present

## 2016-02-26 DIAGNOSIS — I10 Essential (primary) hypertension: Secondary | ICD-10-CM | POA: Diagnosis not present

## 2016-02-26 DIAGNOSIS — Z7984 Long term (current) use of oral hypoglycemic drugs: Secondary | ICD-10-CM | POA: Diagnosis not present

## 2016-02-26 MED ORDER — ONDANSETRON 4 MG PO TBDP
4.0000 mg | ORAL_TABLET | Freq: Once | ORAL | Status: AC | PRN
Start: 2016-02-26 — End: 2016-02-26
  Administered 2016-02-26: 4 mg via ORAL

## 2016-02-26 MED ORDER — ONDANSETRON 4 MG PO TBDP
ORAL_TABLET | ORAL | Status: AC
  Filled 2016-02-26: qty 1

## 2016-02-26 NOTE — ED Notes (Signed)
Jaw pain. He thinks he has a dental abscess. Swelling.

## 2016-02-27 ENCOUNTER — Emergency Department (HOSPITAL_BASED_OUTPATIENT_CLINIC_OR_DEPARTMENT_OTHER)
Admission: EM | Admit: 2016-02-27 | Discharge: 2016-02-27 | Disposition: A | Discharge status: Home / Self Care | Payer: BLUE CROSS/BLUE SHIELD | Attending: Emergency Medicine | Admitting: Emergency Medicine

## 2016-02-27 DIAGNOSIS — K029 Dental caries, unspecified: Secondary | ICD-10-CM

## 2016-02-27 HISTORY — DX: Type 2 diabetes mellitus without complications: E11.9

## 2016-02-27 MED ORDER — PENICILLIN V POTASSIUM 500 MG PO TABS: 500.0000 mg | ORAL_TABLET | Freq: Four times a day (QID) | ORAL | Status: AC

## 2016-02-27 MED ORDER — IBUPROFEN 600 MG PO TABS: 600.0000 mg | ORAL_TABLET | Freq: Four times a day (QID) | ORAL | Status: DC | PRN

## 2016-02-27 MED ORDER — KETOROLAC TROMETHAMINE 60 MG/2ML IM SOLN
60.0000 mg | Freq: Once | INTRAMUSCULAR | Status: AC
  Administered 2016-02-27: 60 mg via INTRAMUSCULAR
  Filled 2016-02-27: qty 2

## 2016-02-27 MED ORDER — BUPIVACAINE-EPINEPHRINE (PF) 0.5% -1:200000 IJ SOLN
1.8000 mL | Freq: Once | INTRAMUSCULAR | Status: AC
  Administered 2016-02-27: 1.8 mL

## 2016-02-27 MED ORDER — BUPIVACAINE-EPINEPHRINE (PF) 0.5% -1:200000 IJ SOLN
INTRAMUSCULAR | Status: AC
  Administered 2016-02-27: 1.8 mL
  Filled 2016-02-27: qty 1.8

## 2016-02-27 MED ORDER — OXYCODONE-ACETAMINOPHEN 5-325 MG PO TABS
1.0000 | ORAL_TABLET | Freq: Once | ORAL | Status: AC
  Administered 2016-02-27: 1 via ORAL
  Filled 2016-02-27: qty 1

## 2016-02-27 NOTE — ED Provider Notes (Signed)
CSN: [id number]     Arrival date & time 02/26/16  2218 History   First MD Initiated Contact with Patient 02/27/16 0047     Chief Complaint  Patient presents with  . Dental Problem     (Consider location/radiation/quality/duration/timing/severity/associated sxs/prior Treatment) HPI  This is a 35 year old male with a history of hypertension and diabetes who presents with right jaw pain. He reports 24 hours of worsening right lower jaw pain. He notes that he has "bad teeth." He thinks he may have an abscess. He reports acute worsening of pain at 10 PM. He reports painful swallowing but no difficulty swallowing. He is able to tolerate his secretions.  Denies any fevers. No recent dental procedures.  Past Medical History  Diagnosis Date  . Hypertension   . Diabetes mellitus without complication (HCC)    History reviewed. No pertinent past surgical history. Family History  Problem Relation Age of Onset  . Adopted: Yes   Social History  Substance Use Topics  . Smoking status: Never Smoker   . Smokeless tobacco: None  . Alcohol Use: No    Review of Systems  Constitutional: Negative for fever.  HENT: Positive for dental problem. Negative for facial swelling.   All other systems reviewed and are negative.     Allergies  Sulfa antibiotics  Home Medications   Prior to Admission medications   Medication Sig Start Date End Date Taking? Authorizing Provider  amphetamine-dextroamphetamine (ADDERALL XR) 20 MG 24 hr capsule Take 20 mg by mouth daily.    Historical Provider, MD  aspirin-acetaminophen-caffeine (EXCEDRIN MIGRAINE) [phone] MG per tablet Take 2 tablets by mouth every 8 (eight) hours as needed for headache.     Historical Provider, MD  bismuth subsalicylate (PEPTO BISMOL) 262 MG/15ML suspension Take 30 mLs by mouth every 6 (six) hours as needed for indigestion.    Historical Provider, MD  calcium carbonate (TUMS EX) 750 MG chewable tablet Chew 1 tablet by mouth 3 (three)  times daily as needed for heartburn.    Historical Provider, MD  Cholecalciferol (VITAMIN D-3 PO) Take 2 tablets by mouth daily.    Historical Provider, MD  gabapentin (NEURONTIN) 100 MG capsule Take 100 mg by mouth daily as needed (restless leg).    Historical Provider, MD  ibuprofen (ADVIL,MOTRIN) 600 MG tablet Take 1 tablet (600 mg total) by mouth every 6 (six) hours as needed. 02/27/16   Shon Baton, MD  loratadine (CLARITIN) 10 MG tablet Take 10 mg by mouth daily as needed for allergies.    Historical Provider, MD  metFORMIN (GLUCOPHAGE) 500 MG tablet Take 1 tablet (500 mg total) by mouth 2 (two) times daily with a meal. 09/27/15   Fayrene Helper, PA-C  penicillin v potassium (VEETID) 500 MG tablet Take 1 tablet (500 mg total) by mouth 4 (four) times daily. 02/27/16 03/05/16  Shon Baton, MD  SUMAtriptan (IMITREX) 50 MG tablet Take 50 mg by mouth 2 (two) times daily as needed for migraine.  04/17/14   Historical Provider, MD   BP 159/107 mmHg  Pulse 89  Temp(Src) 98.1 F (36.7 C) (Oral)  Resp 20  Ht  (1.803 m)  Wt 240 lb (108.863 kg)  BMI 33.49 kg/m2  SpO2 99% Physical Exam  Constitutional: He is oriented to person, place, and time. He appears well-developed and well-nourished. No distress.  HENT:  Head: Normocephalic.  Multiple dental caries noted, broken right lower molar with tenderness to palpation, no obvious drainable abscess, missing crown right lower  premolar, no trismus, posterior oropharynx clear, no fullness noted under the tongue  Eyes: Pupils are equal, round, and reactive to light.  Neck: Neck supple.  Cardiovascular: Normal rate and regular rhythm.   Pulmonary/Chest: Effort normal. No respiratory distress.  Lymphadenopathy:    He has no cervical adenopathy.  Neurological: He is alert and oriented to person, place, and time.  Skin: Skin is warm and dry.  Psychiatric: He has a normal mood and affect.  Nursing note and vitals reviewed.   ED Course  .Nerve  Block Date/Time: 02/27/2016 1:14 AM Performed by: Shon BatonHORTON, Hazell Siwik F Authorized by: Shon BatonHORTON, Makayela Secrest F Consent: Verbal consent obtained. Risks and benefits: risks, benefits and alternatives were discussed Consent given by: patient Patient understanding: patient states understanding of the procedure being performed Patient consent: the patient's understanding of the procedure matches consent given Procedure consent: procedure consent matches procedure scheduled Indications: pain relief Body area: face/mouth Nerve: inferior alveolar Laterality: right Patient sedated: no Patient position: sitting Needle gauge: 24 G Location technique: anatomical landmarks Local anesthetic: bupivacaine 0.5% with epinephrine Anesthetic total: 1.8 ml Outcome: pain improved Patient tolerance: Patient tolerated the procedure well with no immediate complications   (including critical care time) Labs Review Labs Reviewed - No data to display  Imaging Review No results found. I have personally reviewed and evaluated these images and lab results as part of my medical decision-making.   EKG Interpretation None      MDM   Final diagnoses:  Pain due to dental caries    Patient presents with dental pain. Worsening. Does report pain with swallowing but no difficulty swallowing. No obvious signs or symptoms of facial swelling or Ludwigs angina.  No drainable abscess. Dental block was performed. Patient was given one dose of Percocet and Toradol in the ER. Will discharge home with ibuprofen when necessary. Patient states that he has dental follow-up on Friday. Return precautions including facial swelling, difficulty swallowing or any new or worsening symptoms.  After history, exam, and medical workup I feel the patient has been appropriately medically screened and is safe for discharge home. Pertinent diagnoses were discussed with the patient. Patient was given return precautions.     Shon Batonourtney F Beryl Hornberger,  MD 02/27/16 [phone]

## 2016-02-27 NOTE — ED Notes (Signed)
C/o rt jaw pain w some swelling

## 2016-02-27 NOTE — Discharge Instructions (Signed)
You were seen today for dental pain. This is likely early abscess. You need to follow-up with her dentist as soon as possible. He will be given antibiotics. Ibuprofen as needed for pain. It you develop difficulty swallowing, worsening swelling or any new or worsening symptoms she needs to be reevaluated immediately.  Dental Pain Dental pain may be caused by many things, including:  Tooth decay (cavities or caries). Cavities expose the nerve of your tooth to air and hot or cold temperatures. This can cause pain or discomfort.  Abscess or infection. A dental abscess is a collection of infected pus from a bacterial infection in the inner part of the tooth (pulp). It usually occurs at the end of the tooth's root.  Injury.  An unknown reason (idiopathic). Your pain may be mild or severe. It may only occur when:  You are chewing.  You are exposed to hot or cold temperature.  You are eating or drinking sugary foods or beverages, such as soda or candy. Your pain may also be constant. HOME CARE INSTRUCTIONS Watch your dental pain for any changes. The following actions may help to lessen any discomfort that you are feeling:  Take medicines only as directed by your dentist.  If you were prescribed an antibiotic medicine, finish all of it even if you start to feel better.  Keep all follow-up visits as directed by your dentist. This is important.  Do not apply heat to the outside of your face.  Rinse your mouth or gargle with salt water if directed by your dentist. This helps with pain and swelling.  You can make salt water by adding  tsp of salt to 1 cup of warm water.  Apply ice to the painful area of your face:  Put ice in a plastic bag.  Place a towel between your skin and the bag.  Leave the ice on for 20 minutes, 2-3 times per day.  Avoid foods or drinks that cause you pain, such as:  Very hot or very cold foods or drinks.  Sweet or sugary foods or drinks. SEEK MEDICAL CARE  IF:  Your pain is not controlled with medicines.  Your symptoms are worse.  You have new symptoms. SEEK IMMEDIATE MEDICAL CARE IF:  You are unable to open your mouth.  You are having trouble breathing or swallowing.  You have a fever.  Your face, neck, or jaw is swollen.   This information is not intended to replace advice given to you by your health care provider. Make sure you discuss any questions you have with your health care provider.   Document Released: 11/17/2005 Document Revised: 04/03/2015 Document Reviewed: 11/13/2014 Elsevier Interactive Patient Education [employer]2016 [employer] Inc[employer]

## 2016-03-25 DIAGNOSIS — E119 Type 2 diabetes mellitus without complications: Secondary | ICD-10-CM | POA: Insufficient documentation

## 2017-01-14 ENCOUNTER — Encounter (HOSPITAL_COMMUNITY): Payer: Self-pay | Admitting: Emergency Medicine

## 2017-01-14 ENCOUNTER — Emergency Department (HOSPITAL_COMMUNITY)
Admission: EM | Admit: 2017-01-14 | Discharge: 2017-01-14 | Discharge status: Against Medical Advice | Payer: PRIVATE HEALTH INSURANCE | Attending: Emergency Medicine | Admitting: Emergency Medicine

## 2017-01-14 DIAGNOSIS — Z7984 Long term (current) use of oral hypoglycemic drugs: Secondary | ICD-10-CM | POA: Diagnosis not present

## 2017-01-14 DIAGNOSIS — R519 Headache, unspecified: Secondary | ICD-10-CM

## 2017-01-14 DIAGNOSIS — I10 Essential (primary) hypertension: Secondary | ICD-10-CM | POA: Diagnosis not present

## 2017-01-14 DIAGNOSIS — Z79899 Other long term (current) drug therapy: Secondary | ICD-10-CM | POA: Diagnosis not present

## 2017-01-14 DIAGNOSIS — R51 Headache: Secondary | ICD-10-CM | POA: Diagnosis not present

## 2017-01-14 DIAGNOSIS — Z7982 Long term (current) use of aspirin: Secondary | ICD-10-CM | POA: Diagnosis not present

## 2017-01-14 DIAGNOSIS — E119 Type 2 diabetes mellitus without complications: Secondary | ICD-10-CM | POA: Insufficient documentation

## 2017-01-14 HISTORY — DX: Migraine, unspecified, not intractable, without status migrainosus: G43.909

## 2017-01-14 LAB — CBC WITH DIFFERENTIAL/PLATELET
BASOS ABS: 0 10*3/uL (ref 0.0–0.1)
Basophils Relative: 0 %
EOS PCT: 1 %
Eosinophils Absolute: 0.1 10*3/uL (ref 0.0–0.7)
HCT: 39.2 % (ref 39.0–52.0)
Hemoglobin: 14 g/dL (ref 13.0–17.0)
Lymphocytes Relative: 33 %
Lymphs Abs: 2.8 10*3/uL (ref 0.7–4.0)
MCH: 29.4 pg (ref 26.0–34.0)
MCHC: 35.7 g/dL (ref 30.0–36.0)
MCV: 82.4 fL (ref 78.0–100.0)
MONO ABS: 0.5 10*3/uL (ref 0.1–1.0)
MONOS PCT: 6 %
Neutro Abs: 5.1 10*3/uL (ref 1.7–7.7)
Neutrophils Relative %: 60 %
PLATELETS: 186 10*3/uL (ref 150–400)
RBC: 4.76 MIL/uL (ref 4.22–5.81)
RDW: 13.3 % (ref 11.5–15.5)
WBC: 8.6 10*3/uL (ref 4.0–10.5)

## 2017-01-14 LAB — BASIC METABOLIC PANEL
Anion gap: 10 (ref 5–15)
BUN: 15 mg/dL (ref 6–20)
CALCIUM: 9.7 mg/dL (ref 8.9–10.3)
CO2: 24 mmol/L (ref 22–32)
Chloride: 103 mmol/L (ref 101–111)
Creatinine, Ser: 0.96 mg/dL (ref 0.61–1.24)
GFR calc Af Amer: >60 mL/min (ref >60)
GLUCOSE: 109 mg/dL — AB (ref 65–99)
Potassium: 3.8 mmol/L (ref 3.5–5.1)
Sodium: 137 mmol/L (ref 135–145)

## 2017-01-14 MED ORDER — DIPHENHYDRAMINE HCL 50 MG/ML IJ SOLN: 25.0000 mg | Freq: Once | INTRAMUSCULAR | Status: DC

## 2017-01-14 MED ORDER — SODIUM CHLORIDE 0.9 % IV BOLUS (SEPSIS): 1000.0000 mL | Freq: Once | INTRAVENOUS | Status: DC

## 2017-01-14 MED ORDER — METOCLOPRAMIDE HCL 5 MG/ML IJ SOLN: 10.0000 mg | Freq: Once | INTRAMUSCULAR | Status: DC
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
## 2020-12-01 DIAGNOSIS — U071 COVID-19: Secondary | ICD-10-CM

## 2020-12-01 HISTORY — DX: COVID-19: U07.1

## 2021-05-08 ENCOUNTER — Emergency Department (HOSPITAL_COMMUNITY)
Admission: EM | Admit: 2021-05-08 | Discharge: 2021-05-08 | Disposition: A | Discharge status: Home / Self Care | Payer: 59 | Attending: Emergency Medicine | Admitting: Emergency Medicine

## 2021-05-08 ENCOUNTER — Emergency Department (HOSPITAL_COMMUNITY): Payer: 59

## 2021-05-08 ENCOUNTER — Encounter: Payer: Self-pay | Admitting: Emergency Medicine

## 2021-05-08 ENCOUNTER — Encounter (HOSPITAL_COMMUNITY): Payer: Self-pay | Admitting: *Deleted

## 2021-05-08 ENCOUNTER — Emergency Department
Admission: EM | Admit: 2021-05-08 | Discharge: 2021-05-08 | Disposition: A | Discharge status: Home / Self Care | Payer: 59 | Source: Home / Self Care

## 2021-05-08 ENCOUNTER — Other Ambulatory Visit: Payer: Self-pay

## 2021-05-08 DIAGNOSIS — Z79899 Other long term (current) drug therapy: Secondary | ICD-10-CM | POA: Insufficient documentation

## 2021-05-08 DIAGNOSIS — Z20822 Contact with and (suspected) exposure to covid-19: Secondary | ICD-10-CM | POA: Insufficient documentation

## 2021-05-08 DIAGNOSIS — K439 Ventral hernia without obstruction or gangrene: Secondary | ICD-10-CM | POA: Insufficient documentation

## 2021-05-08 DIAGNOSIS — E119 Type 2 diabetes mellitus without complications: Secondary | ICD-10-CM | POA: Insufficient documentation

## 2021-05-08 DIAGNOSIS — K460 Unspecified abdominal hernia with obstruction, without gangrene: Secondary | ICD-10-CM

## 2021-05-08 DIAGNOSIS — I10 Essential (primary) hypertension: Secondary | ICD-10-CM | POA: Diagnosis not present

## 2021-05-08 DIAGNOSIS — Z7984 Long term (current) use of oral hypoglycemic drugs: Secondary | ICD-10-CM | POA: Diagnosis not present

## 2021-05-08 DIAGNOSIS — Z8616 Personal history of COVID-19: Secondary | ICD-10-CM | POA: Insufficient documentation

## 2021-05-08 DIAGNOSIS — K432 Incisional hernia without obstruction or gangrene: Secondary | ICD-10-CM

## 2021-05-08 DIAGNOSIS — R109 Unspecified abdominal pain: Secondary | ICD-10-CM | POA: Diagnosis present

## 2021-05-08 LAB — LIPASE, BLOOD: Lipase: 31 U/L (ref 11–51)

## 2021-05-08 LAB — CBC WITH DIFFERENTIAL/PLATELET
Abs Immature Granulocytes: 0.03 10*3/uL (ref 0.00–0.07)
Basophils Absolute: 0 10*3/uL (ref 0.0–0.1)
Basophils Relative: 0 %
Eosinophils Absolute: 0.1 10*3/uL (ref 0.0–0.5)
Eosinophils Relative: 2 %
HCT: 42.8 % (ref 39.0–52.0)
Hemoglobin: 15.5 g/dL (ref 13.0–17.0)
Immature Granulocytes: 1 %
Lymphocytes Relative: 28 %
Lymphs Abs: 1.6 10*3/uL (ref 0.7–4.0)
MCH: 30 pg (ref 26.0–34.0)
MCHC: 36.2 g/dL — ABNORMAL HIGH (ref 30.0–36.0)
MCV: 82.9 fL (ref 80.0–100.0)
Monocytes Absolute: 0.4 10*3/uL (ref 0.1–1.0)
Monocytes Relative: 8 %
Neutro Abs: 3.4 10*3/uL (ref 1.7–7.7)
Neutrophils Relative %: 61 %
Platelets: 159 10*3/uL (ref 150–400)
RBC: 5.16 MIL/uL (ref 4.22–5.81)
RDW: 12.8 % (ref 11.5–15.5)
WBC: 5.5 10*3/uL (ref 4.0–10.5)
nRBC: 0 % (ref 0.0–0.2)

## 2021-05-08 LAB — COMPREHENSIVE METABOLIC PANEL
ALT: 19 U/L (ref 0–44)
AST: 17 U/L (ref 15–41)
Albumin: 4.7 g/dL (ref 3.5–5.0)
Alkaline Phosphatase: 62 U/L (ref 38–126)
Anion gap: 7 (ref 5–15)
BUN: 19 mg/dL (ref 6–20)
CO2: 25 mmol/L (ref 22–32)
Calcium: 9.2 mg/dL (ref 8.9–10.3)
Chloride: 102 mmol/L (ref 98–111)
Creatinine, Ser: 0.92 mg/dL (ref 0.61–1.24)
GFR, Estimated: >60 mL/min (ref >60)
Glucose, Bld: 350 mg/dL — ABNORMAL HIGH (ref 70–99)
Potassium: 3.7 mmol/L (ref 3.5–5.1)
Sodium: 134 mmol/L — ABNORMAL LOW (ref 135–145)
Total Bilirubin: 0.6 mg/dL (ref 0.3–1.2)
Total Protein: 7.7 g/dL (ref 6.5–8.1)

## 2021-05-08 LAB — RESP PANEL BY RT-PCR (FLU A&B, COVID) ARPGX2
Influenza A by PCR: NEGATIVE
Influenza B by PCR: NEGATIVE
SARS Coronavirus 2 by RT PCR: NEGATIVE

## 2021-05-08 MED ORDER — FENTANYL CITRATE (PF) 100 MCG/2ML IJ SOLN
50.0000 ug | Freq: Once | INTRAMUSCULAR | Status: AC
  Administered 2021-05-08: 10:00:00 50 ug via INTRAVENOUS
  Filled 2021-05-08: qty 2

## 2021-05-08 MED ORDER — FENTANYL CITRATE (PF) 100 MCG/2ML IJ SOLN
50.0000 ug | Freq: Once | INTRAMUSCULAR | Status: DC
  Filled 2021-05-08: qty 2

## 2021-05-08 NOTE — ED Provider Notes (Signed)
[facility]  ____________________________________________  Time seen: Approximately 8:35 AM  I have reviewed the triage vital signs and the nursing notes.   HISTORY  Chief Complaint Abdominal Pain   Historian Patient    HPI Dale Scott is a 40 y.o. male with a history of diabetes, hypertension and migraines, presents to the emergency department with concern for an incarcerated umbilical hernia.  Patient has had umbilical hernia for a long time which is usually reducible.  Patient reports that he was unable to reduce hernia last night and area became painful.  No erythema of the skin surrounding hernia.  No vomiting.  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  No chest pain or chest tightness.  No other alleviating measures have been attempted.   Past Medical History:  Diagnosis Date  . COVID-19 12/2020  . Diabetes mellitus without complication (HCC)   . Hypertension   . Migraine      Immunizations up to date:  Yes.     Past Medical History:  Diagnosis Date  . COVID-19 12/2020  . Diabetes mellitus without complication (HCC)   . Hypertension   . Migraine     Patient Active Problem List   Diagnosis Date Noted  . Right ankle pain 05/20/2011    No past surgical history on file.  Prior to Admission medications   Medication Sig Start Date End Date Taking? Authorizing Provider  aspirin-acetaminophen-caffeine (EXCEDRIN MIGRAINE) [phone] MG per tablet Take 2 tablets by mouth every 8 (eight) hours as needed for headache.    Yes [provider]  bismuth subsalicylate (PEPTO BISMOL) 262 MG/15ML suspension Take 30 mLs by mouth every 6 (six) hours as needed for indigestion.   Yes [provider]  calcium carbonate (TUMS EX) 750 MG chewable tablet Chew 1 tablet by mouth 3 (three) times daily as needed for heartburn.   Yes [provider]  lisinopril (ZESTRIL) 10 MG tablet Take by mouth. 05/22/20  Yes [provider]  loratadine (CLARITIN) 10 MG tablet Take 10 mg by mouth daily as needed for allergies.   Yes [provider]  metFORMIN (GLUCOPHAGE) 500 MG tablet Take 1 tablet (500 mg total) by mouth 2 (two) times daily with a meal. 09/27/15  Yes Fayrene Helper, PA-C  amphetamine-dextroamphetamine (ADDERALL XR) 20 MG 24 hr capsule Take 20 mg by mouth daily.    [provider]  Cholecalciferol (VITAMIN D-3 PO) Take 2 tablets by mouth daily.    [provider]  gabapentin (NEURONTIN) 100 MG capsule Take 100 mg by mouth daily as needed (restless leg).    [provider]  ibuprofen (ADVIL,MOTRIN) 600 MG tablet Take 1 tablet (600 mg total) by mouth every 6 (six) hours as needed. 02/27/16   Horton, Mayer Masker, MD  SUMAtriptan (IMITREX) 50 MG tablet Take 50 mg by mouth 2 (two) times daily as needed for migraine.  04/17/14   [provider]    Allergies Sulfa antibiotics  Family History  Adopted: Yes    Social History Social History   Tobacco Use  . Smoking status: Never Smoker  . Smokeless tobacco: Never Used  Substance Use Topics  . Alcohol use: No  . Drug use: No     Review of Systems  Constitutional: No fever/chills Eyes:  No discharge ENT: No upper respiratory complaints. Respiratory: no cough. No SOB/ use of accessory muscles to breath Gastrointestinal:  Patient has abdominal hernia.  Musculoskeletal: Negative for musculoskeletal pain. Skin:  Negative for rash, abrasions, lacerations, ecchymosis.    ____________________________________________   PHYSICAL EXAM:  VITAL SIGNS: ED Triage Vitals  Enc Vitals Group     BP 05/08/21 0818 133/87     Pulse Rate 05/08/21 0818 93     Resp 05/08/21 0818 16     Temp 05/08/21 0818 98.8 F (37.1 C)     Temp Source 05/08/21 0818 Oral     SpO2 05/08/21 0818 100 %     Weight 05/08/21 0822 230 lb (104.3 kg)     Height 05/08/21 0822 5\' 11"  (1.803 m)     Head Circumference --      Peak Flow --       Pain Score 05/08/21 0821 2     Pain Loc --      Pain Edu? --      Excl. in GC? --      Constitutional: Alert and oriented. Well appearing and in no acute distress. Eyes: Conjunctivae are normal. PERRL. EOMI. Head: Atraumatic. ENT: Cardiovascular: Normal rate, regular rhythm. Normal S1 and S2.  Good peripheral circulation. Respiratory: Normal respiratory effort without tachypnea or retractions. Lungs CTAB. Good air entry to the bases with no decreased or absent breath sounds Gastrointestinal: Bowel sounds x 4 quadrants.  Patient has palpable umbilical hernia that is not reducible despite attempts in the urgent care.  Patient has tenderness with guarding. Musculoskeletal: Full range of motion to all extremities. No obvious deformities noted Neurologic:  Normal for age. No gross focal neurologic deficits are appreciated.  Skin:  Skin is warm, dry and intact. No rash noted. Psychiatric: Mood and affect are normal for age. Speech and behavior are normal.   ____________________________________________   LABS (all labs ordered are listed, but only abnormal results are displayed)  Labs Reviewed - No data to display ____________________________________________  EKG   ____________________________________________  RADIOLOGY   No results found.  ____________________________________________    PROCEDURES  Procedure(s) performed:     Procedures     Medications - No data to display   ____________________________________________   INITIAL IMPRESSION / ASSESSMENT AND PLAN / ED COURSE  Pertinent labs & imaging results that were available during my care of the patient were reviewed by me and considered in my medical decision making (see chart for details).      Assessment and Plan:  Hernia Abdominal pain 40 year old male presents to the emergency department with periumbilical pain.  On exam, patient had a palpable umbilical hernia that was not reducible at the  urgent care.  Patient was referred to [facility] for further care and management given concern for incarcerated hernia.     ____________________________________________  FINAL CLINICAL IMPRESSION(S) / ED DIAGNOSES  Final diagnoses:  Incarcerated hernia      NEW MEDICATIONS STARTED DURING THIS VISIT:  ED Discharge Orders    None          This chart was dictated using voice recognition software/Dragon. Despite best efforts to proofread, errors can occur which can change the meaning. Any change was purely unintentional.     [facility], PA-C 05/08/21 0840

## 2021-05-08 NOTE — ED Provider Notes (Signed)
[facility]-EMERGENCY DEPT Provider Note   CSN: [id number] Arrival date & time: 05/08/21  4481     History Chief Complaint  Patient presents with  . Hernia    Dale Scott is a 40 y.o. male who presents on recommendation from urgent care for concern for incarcerated umbilical hernia..  Patient endorses extensive history of umbilical hernia, however states that he has been able to self reduce the hernia without issues in the past.  He states last With his childhood jumped on his abdomen and since that time he has had increasing pain to the area without erythema.  Denies any vomiting but does endorse some nausea.  Last normal bowel movement was last night, has not had any bowel movements today, though he states he is still passing gas per rectum.  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  Denies any fevers or chills at home denies any urinary symptoms.  I personally reviewed this patient's medical record.  He has history of diabetes and hypertension as well as migraines.  He states that he is taking his metformin, however is not on his Trulicity anymore because he has not had it filled, and states he needs to follow-up with his primary care doctor.  HPI     Past Medical History:  Diagnosis Date  . COVID-19 12/2020  . Diabetes mellitus without complication (HCC)   . Hypertension   . Migraine     Patient Active Problem List   Diagnosis Date Noted  . Right ankle pain 05/20/2011    History reviewed. No pertinent surgical history.     Family History  Adopted: Yes    Social History   Tobacco Use  . Smoking status: Never Smoker  . Smokeless tobacco: Never Used  Substance Use Topics  . Alcohol use: No  . Drug use: No    Home Medications Prior to Admission medications   Medication Sig Start Date End Date Taking? Authorizing Provider  acetaminophen (TYLENOL) 500 MG tablet Take 500 mg by mouth every 6 (six) hours as needed for mild pain,  fever or headache.   Yes [provider]  lisinopril (ZESTRIL) 10 MG tablet Take by mouth. 05/22/20  Yes [provider]  metFORMIN (GLUCOPHAGE-XR) 500 MG 24 hr tablet Take 500 mg by mouth 2 (two) times daily. 05/03/21  Yes [provider]  metFORMIN (GLUCOPHAGE) 500 MG tablet Take 1 tablet (500 mg total) by mouth 2 (two) times daily with a meal. Patient not taking: Reported on 05/08/2021 09/27/15   Fayrene Helper, PA-C    Allergies    Atorvastatin, Other, and Sulfa antibiotics  Review of Systems   Review of Systems  Constitutional: Negative.   HENT: Negative.   Eyes: Negative.   Respiratory: Negative.   Cardiovascular: Negative.   Gastrointestinal: Positive for abdominal pain and nausea. Negative for anal bleeding, blood in stool, constipation, diarrhea and vomiting.  Genitourinary: Negative.   Musculoskeletal: Negative.   Skin: Negative.   Neurological: Negative.     Physical Exam Updated Vital Signs BP (!) 156/96   Pulse 82   Temp 98.7 F (37.1 C) (Oral)   Resp 14   SpO2 99%   Physical Exam Vitals and nursing note reviewed.  Constitutional:      Appearance: He is obese. He is not ill-appearing or toxic-appearing.  HENT:     Head: Normocephalic and atraumatic.     Nose: Nose normal.     Mouth/Throat:     Mouth: Mucous  membranes are moist.     Pharynx: Oropharynx is clear. Uvula midline. No oropharyngeal exudate, posterior oropharyngeal erythema or uvula swelling.     Tonsils: No tonsillar exudate.  Eyes:     General: Lids are normal. Vision grossly intact.        Right eye: No discharge.        Left eye: No discharge.     Extraocular Movements: Extraocular movements intact.     Conjunctiva/sclera: Conjunctivae normal.     Pupils: Pupils are equal, round, and reactive to light.  Neck:     Trachea: Trachea and phonation normal.  Cardiovascular:     Rate and Rhythm: Normal rate and regular rhythm.     Pulses: Normal pulses.     Heart sounds:  Normal heart sounds. No murmur heard.   Pulmonary:     Effort: Pulmonary effort is normal. No tachypnea, bradypnea, accessory muscle usage, prolonged expiration or respiratory distress.     Breath sounds: Normal breath sounds. No wheezing or rales.  Chest:     Chest wall: No mass, lacerations, deformity, swelling, tenderness, crepitus or edema.  Abdominal:     General: Bowel sounds are normal. There is no distension.     Palpations: Abdomen is soft.     Tenderness: There is abdominal tenderness. There is guarding. There is no right CVA tenderness, left CVA tenderness or rebound.     Hernia: A hernia is present. Hernia is present in the umbilical area.    Musculoskeletal:        General: No deformity.     Cervical back: Normal range of motion and neck supple. No edema, rigidity or crepitus. No pain with movement, spinous process tenderness or muscular tenderness.     Right lower leg: No edema.     Left lower leg: No edema.  Lymphadenopathy:     Cervical: No cervical adenopathy.  Skin:    General: Skin is warm and dry.     Capillary Refill: Capillary refill takes less than 2 seconds.  Neurological:     General: No focal deficit present.     Mental Status: He is alert and oriented to person, place, and time. Mental status is at baseline.  Psychiatric:        Mood and Affect: Mood normal.     ED Results / Procedures / Treatments   Labs (all labs ordered are listed, but only abnormal results are displayed) Labs Reviewed  COMPREHENSIVE METABOLIC PANEL - Abnormal; Notable for the following components:      Result Value   Sodium 134 (*)    Glucose, Bld 350 (*)    All other components within normal limits  CBC WITH DIFFERENTIAL/PLATELET - Abnormal; Notable for the following components:   MCHC 36.2 (*)    All other components within normal limits  RESP PANEL BY RT-PCR (FLU A&B, COVID) ARPGX2  LIPASE, BLOOD    EKG EKG Interpretation  Date/Time:  Wednesday May 08 2021  10:21:01 EDT Ventricular Rate:  80 PR Interval:  153 QRS Duration: 99 QT Interval:  370 QTC Calculation: 427 R Axis:   30 Text Interpretation: Sinus rhythm Baseline wander in lead(s) II III aVF Confirmed by Kristine Royal [phone]) on 05/08/2021 10:26:37 AM   Radiology CT Abdomen Pelvis Wo Contrast  Result Date: 05/08/2021 CLINICAL DATA:  Known umbilical hernia.  Nausea.  Abdominal pain EXAM: CT ABDOMEN AND PELVIS WITHOUT CONTRAST TECHNIQUE: Multidetector CT imaging of the abdomen and pelvis was performed following the standard protocol  without IV contrast. COMPARISON:  None. FINDINGS: Lower chest: No acute abnormality. Hepatobiliary: No focal hepatic abnormality. Gallbladder unremarkable. Pancreas: No focal abnormality or ductal dilatation. Spleen: No focal abnormality.  Normal size. Adrenals/Urinary Tract: No adrenal abnormality. No focal renal abnormality. No stones or hydronephrosis. Urinary bladder is unremarkable. Stomach/Bowel: Normal appendix. Stomach, large and small bowel grossly unremarkable. Vascular/Lymphatic: No evidence of aneurysm or adenopathy. Scattered iliac calcifications. Reproductive: No visible focal abnormality. Other: No free fluid or free air. Very small umbilical hernia containing fat. There is a larger paraumbilical ventral hernia just above the umbilicus which contains fat. This is likely what is palpable and thought represent an umbilical hernia. Musculoskeletal: No acute bony abnormality. IMPRESSION: Small supraumbilical ventral hernia immediately adjacent to the umbilicus containing fat. Much smaller umbilical hernia containing fat. No acute findings in the abdomen or pelvis. Electronically Signed   By: Charlett Nose M.D.   On: 05/08/2021 10:36    Procedures Procedures   Medications Ordered in ED Medications  fentaNYL (SUBLIMAZE) injection 50 mcg (50 mcg Intravenous Given 05/08/21 1027)    ED Course  I have reviewed the triage vital signs and the nursing  notes.  Pertinent labs & imaging results that were available during my care of the patient were reviewed by me and considered in my medical decision making (see chart for details).    MDM Rules/Calculators/A&P                         40 year old male history of supraumbilical hernia who presents with concern for incarcerated, seen in urgent care this morning.  Differential diagnosis includes but is limited to incarcerated hernia, strangulated hernia intestinal obstruction or mesenteric ischemia.  Vital signs are normal on intake.  Cardiopulmonary exam is normal abdominal exam revealed generalized tenderness to palpation of the central abdomen small nonreducible supraumbilical hernia and very tiny nonreducible umbilical hernia with extensive tenderness palpation without skin changes.  Analgesia offered, CBC unremarkable, CMP with hyperglycemia, patient with known diabetes.  Lipase is normal.  CT of the abdomen pelvis revealed fat-containing supraumbilical ventral hernia without bowel involvement or bowel obstruction.  Patient was evaluated bedside by attending physician who reduced the hernia bedside successfully.  Patient reevaluated by this provider and his symptoms.  No further work-up warranted in ED at this time.  Will discharge with close general surgery follow-up given patient has extremely active job and children at home which could put him at risk for incarceration of his hernia.  Isami voiced understanding of his medical evaluation treatment plan.  Each of his questions was answered to his expressed satisfaction.  Return precautions were given.  Patient is stable and appropriate for discharge at this time.  This chart was dictated using voice recognition software, Dragon. Despite the best efforts of this provider to proofread and correct errors, errors may still occur which can change documentation meaning.  Final Clinical Impression(s) / ED Diagnoses Final diagnoses:  Ventral  hernia, recurrent    Rx / DC Orders ED Discharge Orders    None       Sherrilee Gilles 05/08/21 1111    Wynetta Fines, MD 05/08/21 1404

## 2021-05-08 NOTE — ED Triage Notes (Signed)
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

## 2021-05-08 NOTE — ED Notes (Signed)
Patient is being discharged from the Urgent Care and sent to the Emergency Department via POV. Per J. Woods, [place], patient is in need of higher level of care due to possible incarcerated hernia. Patient is aware and verbalizes understanding of plan of care.  Vitals:   05/08/21 0818  BP: 133/87  Pulse: 93  Resp: 16  Temp: 98.8 F (37.1 C)  SpO2: 100%  Report to [facility] ED charge - Dena Billet, RN

## 2021-05-08 NOTE — ED Triage Notes (Signed)
Pt sent here from urgent care for unreducable umbilical. He reports some nausea, no vomiting. Had normal BM last night.

## 2021-05-08 NOTE — Discharge Instructions (Signed)
Please go to [facility] for further care and management.  [facility] [address] [place], [place] [address]

## 2021-05-08 NOTE — Discharge Instructions (Signed)
You were seen in the emergency department for your hernia.  Your blood work and CT scan were very reassuring, and your hernia was able to be reduced in the emergency department.  Below is contact information for [facility] surgery, who with whom you should follow-up for evaluation for hernia repair.  Return to ER if you develop any worsening abdominal pain, fever becomes irreducible again, you known to passing gas or stool, you develop nausea or vomiting that does not stop, or develop any other new severe symptoms.

## 2021-05-29 ENCOUNTER — Encounter: Payer: Self-pay | Admitting: Family Medicine

## 2021-05-29 ENCOUNTER — Other Ambulatory Visit: Payer: Self-pay

## 2021-05-29 ENCOUNTER — Ambulatory Visit (INDEPENDENT_AMBULATORY_CARE_PROVIDER_SITE_OTHER): Payer: 59 | Admitting: Family Medicine

## 2021-05-29 VITALS — BP 143/83 | HR 97 | Temp 98.1°F | Ht 71.0 in | Wt 208.0 lb

## 2021-05-29 DIAGNOSIS — I10 Essential (primary) hypertension: Secondary | ICD-10-CM | POA: Diagnosis not present

## 2021-05-29 DIAGNOSIS — E119 Type 2 diabetes mellitus without complications: Secondary | ICD-10-CM

## 2021-05-29 DIAGNOSIS — R21 Rash and other nonspecific skin eruption: Secondary | ICD-10-CM | POA: Diagnosis not present

## 2021-05-29 DIAGNOSIS — K429 Umbilical hernia without obstruction or gangrene: Secondary | ICD-10-CM | POA: Diagnosis not present

## 2021-05-29 MED ORDER — TRULICITY 0.75 MG/0.5ML ~~LOC~~ SOAJ
SUBCUTANEOUS | 0 refills | Status: DC
Start: 2021-05-29 — End: 2021-07-24

## 2021-05-29 MED ORDER — METFORMIN HCL 500 MG PO TABS: 1000.0000 mg | ORAL_TABLET | Freq: Two times a day (BID) | ORAL | 1 refills | Status: DC

## 2021-05-29 MED ORDER — TRULICITY 1.5 MG/0.5ML ~~LOC~~ SOAJ: 1.5000 mg | SUBCUTANEOUS | 3 refills | Status: DC

## 2021-05-29 MED ORDER — CLOBETASOL PROPIONATE 0.05 % EX CREA: 1.0000 "application " | TOPICAL_CREAM | Freq: Two times a day (BID) | CUTANEOUS | 1 refills | Status: DC

## 2021-05-29 MED ORDER — LISINOPRIL 10 MG PO TABS: 10.0000 mg | ORAL_TABLET | Freq: Every day | ORAL | 1 refills | Status: DC

## 2021-05-29 NOTE — Assessment & Plan Note (Signed)
Unclear etiology of rash.  He has had response to steroid but not full resolution.  I am adding clobetasol cream in addition to him completing oral steroid.  I do not see any signs of burrows or other signs of scabies however this remains in the differential if not resolving.

## 2021-05-29 NOTE — Patient Instructions (Signed)
Restart medications for diabetes We'll be in touch with lab results.  Work on dietary changes.  See me again in 8 weeks.

## 2021-05-29 NOTE — Assessment & Plan Note (Signed)
He has been out of lisinopril, prescription renewed for this.

## 2021-05-29 NOTE — Assessment & Plan Note (Signed)
Diabetes is not well controlled.  I will have him increase his metformin to 1000 mg daily as well as increase his Trulicity to 1 mg after restarting the 0.75 mg for an initial 4-week period.  We discussed making changes to diet for better control of blood glucose.  Updated labs ordered.

## 2021-05-29 NOTE — Assessment & Plan Note (Signed)
No pain or tenderness at this time.  He has visit with surgeon in August.

## 2021-05-29 NOTE — Progress Notes (Signed)
Dale Scott - 40 y.o. male MRN [id number]  Date of birth: 03-15-1981  Subjective Chief Complaint  Patient presents with   Establish Care    HPI Dale Scott is a 40 year old male here today for initial visit to establish care.  He has a history of type 2 diabetes.  He was also seen recently at the emergency room due to possible incarcerated hernia.  This was reduced by ED physician with improvement of symptoms and he was discharged home.  He does have a follow-up with a surgeon but not until August.  He denies any pain at this time.  Bowels are moving normally.  His diabetes has not been well controlled recently.  He has been out of medication.  Medications he has been taking are metformin 500 mg daily as well as Trulicity 0.75 mg weekly.  He has tolerated these well.  He does monitor blood sugars at home.  These have been more elevated due to steroid use.  He denies any symptoms including increased thirst or urination.  He has had a rash as well on his legs and trunk.  He was started on prednisone and has had improvement of this however but not complete resolution.  Itching has diminished quite a bit.  Denies any changes soaps, detergents, lotions or medications recently.  ROS:  A comprehensive ROS was completed and negative except as noted per HPI  Allergies  Allergen Reactions   Atorvastatin Nausea And Vomiting and Other (See Comments)    Severe GI upset   Other Rash    Cinnamon    Sulfa Antibiotics     Childhood allergy    Past Medical History:  Diagnosis Date   COVID-19 12/2020   Diabetes mellitus without complication (HCC)    Hypertension    Migraine     History reviewed. No pertinent surgical history.  Social History   Socioeconomic History   Marital status: Married    Spouse name: Not on file   Number of children: Not on file   Years of education: Not on file   Highest education level: Not on file  Occupational History   Not on file  Tobacco Use   Smoking status:  Never   Smokeless tobacco: Never  Vaping Use   Vaping Use: Never used  Substance and Sexual Activity   Alcohol use: No   Drug use: No   Sexual activity: Not on file  Other Topics Concern   Not on file  Social History Narrative   Not on file   Social Determinants of Health   Financial Resource Strain: Not on file  Food Insecurity: Not on file  Transportation Needs: Not on file  Physical Activity: Not on file  Stress: Not on file  Social Connections: Not on file    Family History  Adopted: Yes    Health Maintenance  Topic Date Due   HEMOGLOBIN A1C  Never done   OPHTHALMOLOGY EXAM  Never done   HIV Screening  Never done   Hepatitis C Screening  Never done   COVID-19 Vaccine (3 - Booster for Moderna series) 05/27/2020   INFLUENZA VACCINE  07/01/2021   FOOT EXAM  05/29/2022   TETANUS/TDAP  12/16/2023   PNEUMOCOCCAL POLYSACCHARIDE VACCINE AGE 70-64 HIGH RISK  Completed   Pneumococcal Vaccine 97-37 Years old  Aged Out   HPV VACCINES  Aged Out     ----------------------------------------------------------------------------------------------------------------------------------------------------------------------------------------------------------------- Physical Exam BP (!) 143/83 (BP Location: Left Arm, Patient Position: Sitting, Cuff Size: Normal)   Pulse  97   Temp 98.1 F (36.7 C) (Oral)   Ht 5\' 11"  (1.803 m)   Wt 208 lb (94.3 kg)   SpO2 97%   BMI 29.01 kg/m   Physical Exam Constitutional:      Appearance: Normal appearance.  HENT:     Head: Normocephalic and atraumatic.  Eyes:     General: No scleral icterus. Cardiovascular:     Rate and Rhythm: Normal rate and regular rhythm.  Pulmonary:     Effort: Pulmonary effort is normal.     Breath sounds: Normal breath sounds.  Musculoskeletal:     Cervical back: Neck supple.  Skin:    Comments: He has scattered, excoriated papules on bilateral legs as well as trunk.  There is no induration or signs of infection  at this time.  Neurological:     General: No focal deficit present.     Mental Status: He is alert.  Psychiatric:        Mood and Affect: Mood normal.        Behavior: Behavior normal.    ------------------------------------------------------------------------------------------------------------------------------------------------------------------------------------------------------------------- Assessment and Plan  Type 2 diabetes mellitus without complication (HCC) Diabetes is not well controlled.  I will have him increase his metformin to 1000 mg daily as well as increase his Trulicity to 1 mg after restarting the 0.75 mg for an initial 4-week period.  We discussed making changes to diet for better control of blood glucose.  Updated labs ordered.  Rash Unclear etiology of rash.  He has had response to steroid but not full resolution.  I am adding clobetasol cream in addition to him completing oral steroid.  I do not see any signs of burrows or other signs of scabies however this remains in the differential if not resolving.  Essential hypertension He has been out of lisinopril, prescription renewed for this.  Umbilical hernia No pain or tenderness at this time.  He has visit with surgeon in August.   Meds ordered this encounter  Medications   Dulaglutide (TRULICITY) 0.75 MG/0.5ML SOPN    Sig: SMARTSIG:0.5 Milliliter(s) SUB-Q Once a Week    Dispense:  2 mL    Refill:  0   Dulaglutide (TRULICITY) 1.5 MG/0.5ML SOPN    Sig: Inject 1.5 mg into the skin once a week. Start after using 0.75mg  dose x4 weeks.    Dispense:  2 mL    Refill:  3   DISCONTD: metFORMIN (GLUCOPHAGE) 500 MG tablet    Sig: Take 2 tablets (1,000 mg total) by mouth 2 (two) times daily with a meal.    Dispense:  180 tablet    Refill:  1   lisinopril (ZESTRIL) 10 MG tablet    Sig: Take 1 tablet (10 mg total) by mouth daily.    Dispense:  90 tablet    Refill:  1   clobetasol cream (TEMOVATE) 0.05 %    Sig:  Apply 1 application topically 2 (two) times daily.    Dispense:  80 g    Refill:  1   metFORMIN (GLUCOPHAGE) 500 MG tablet    Sig: Take 2 tablets (1,000 mg total) by mouth 2 (two) times daily with a meal.    Dispense:  180 tablet    Refill:  1    Return in about 8 weeks (around 07/24/2021) for DM.    This visit occurred during the SARS-CoV-2 public health emergency.  Safety protocols were in place, including screening questions prior to the visit, additional usage of staff PPE,  and extensive cleaning of exam room while observing appropriate contact time as indicated for disinfecting solutions.

## 2021-05-30 ENCOUNTER — Encounter: Payer: Self-pay | Admitting: Family Medicine

## 2021-05-30 ENCOUNTER — Other Ambulatory Visit: Payer: Self-pay | Admitting: Family Medicine

## 2021-05-30 LAB — CBC WITH DIFFERENTIAL/PLATELET
Absolute Monocytes: 262 cells/uL (ref 200–950)
Basophils Absolute: 28 cells/uL (ref 0–200)
Basophils Relative: 0.4 %
Eosinophils Absolute: 97 cells/uL (ref 15–500)
Eosinophils Relative: 1.4 %
HCT: 45.6 % (ref 38.5–50.0)
Hemoglobin: 15.8 g/dL (ref 13.2–17.1)
Lymphs Abs: 1194 cells/uL (ref 850–3900)
MCH: 30.3 pg (ref 27.0–33.0)
MCHC: 34.6 g/dL (ref 32.0–36.0)
MCV: 87.4 fL (ref 80.0–100.0)
MPV: 12.5 fL (ref 7.5–12.5)
Monocytes Relative: 3.8 %
Neutro Abs: 5320 cells/uL (ref 1500–7800)
Neutrophils Relative %: 77.1 %
Platelets: 199 10*3/uL (ref 140–400)
RBC: 5.22 10*6/uL (ref 4.20–5.80)
RDW: 13 % (ref 11.0–15.0)
Total Lymphocyte: 17.3 %
WBC: 6.9 10*3/uL (ref 3.8–10.8)

## 2021-05-30 LAB — COMPREHENSIVE METABOLIC PANEL
AG Ratio: 1.8 (calc) (ref 1.0–2.5)
ALT: 14 U/L (ref 9–46)
AST: 13 U/L (ref 10–40)
Albumin: 4.4 g/dL (ref 3.6–5.1)
Alkaline phosphatase (APISO): 70 U/L (ref 36–130)
BUN: 16 mg/dL (ref 7–25)
CO2: 25 mmol/L (ref 20–32)
Calcium: 9.3 mg/dL (ref 8.6–10.3)
Chloride: 97 mmol/L — ABNORMAL LOW (ref 98–110)
Creat: 0.86 mg/dL (ref 0.60–1.35)
Globulin: 2.5 g/dL (calc) (ref 1.9–3.7)
Glucose, Bld: 468 mg/dL — ABNORMAL HIGH (ref 65–99)
Potassium: 4.3 mmol/L (ref 3.5–5.3)
Sodium: 130 mmol/L — ABNORMAL LOW (ref 135–146)
Total Bilirubin: 0.6 mg/dL (ref 0.2–1.2)
Total Protein: 6.9 g/dL (ref 6.1–8.1)

## 2021-05-30 LAB — LIPID PANEL W/REFLEX DIRECT LDL
Cholesterol: 259 mg/dL — ABNORMAL HIGH (ref <200)
HDL: 32 mg/dL — ABNORMAL LOW (ref >=40)
Non-HDL Cholesterol (Calc): 227 mg/dL (calc) — ABNORMAL HIGH (ref <130)
Total CHOL/HDL Ratio: 8.1 (calc) — ABNORMAL HIGH (ref <5.0)
Triglycerides: 1411 mg/dL — ABNORMAL HIGH (ref <150)

## 2021-05-30 LAB — HEMOGLOBIN A1C
Hgb A1c MFr Bld: 11.5 % of total Hgb — ABNORMAL HIGH (ref <5.7)
Mean Plasma Glucose: 283 mg/dL
eAG (mmol/L): 15.7 mmol/L

## 2021-05-30 LAB — DIRECT LDL

## 2021-05-30 MED ORDER — METFORMIN HCL ER 500 MG PO TB24: 1000.0000 mg | ORAL_TABLET | Freq: Every day | ORAL | 1 refills | Status: DC

## 2021-07-24 ENCOUNTER — Ambulatory Visit: Payer: 59 | Admitting: Family Medicine

## 2021-07-24 ENCOUNTER — Encounter: Payer: Self-pay | Admitting: Family Medicine

## 2021-07-24 ENCOUNTER — Other Ambulatory Visit: Payer: Self-pay

## 2021-07-24 DIAGNOSIS — I10 Essential (primary) hypertension: Secondary | ICD-10-CM

## 2021-07-24 DIAGNOSIS — E119 Type 2 diabetes mellitus without complications: Secondary | ICD-10-CM

## 2021-07-24 MED ORDER — TRULICITY 1.5 MG/0.5ML ~~LOC~~ SOAJ: 1.5000 mg | SUBCUTANEOUS | 3 refills | Status: DC

## 2021-07-24 NOTE — Progress Notes (Signed)
Dale Scott - 40 y.o. male MRN [id number]  Date of birth: 06-27-1981  Subjective Chief Complaint  Patient presents with   Diabetes    HPI Dale Scott is a 40 year old male here today for follow-up visit.  At his last visit his blood pressure and diabetes were adequately controlled however he had been out of medications at that time.  He is once again taking metformin and we had him increase his Trulicity at his last visit.  He is doing well with this.  Reports her blood sugars have been better controlled at home.  He denies any symptoms of hypoglycemia.  Weight has been stable.  He has been working on diet And some exercise changes.  Blood pressure is better controlled with adding lisinopril back on.  He does not monitor blood pressure at home.  He denies chest pain, shortness of breath, palpitations, headaches or vision changes.  ROS:  A comprehensive ROS was completed and negative except as noted per HPI  Allergies  Allergen Reactions   Atorvastatin Nausea And Vomiting and Other (See Comments)    Severe GI upset   Other Rash    Cinnamon    Sulfa Antibiotics     Childhood allergy    Past Medical History:  Diagnosis Date   COVID-19 12/2020   Diabetes mellitus without complication (HCC)    Hypertension    Migraine     History reviewed. No pertinent surgical history.  Social History   Socioeconomic History   Marital status: Married    Spouse name: Not on file   Number of children: Not on file   Years of education: Not on file   Highest education level: Not on file  Occupational History   Not on file  Tobacco Use   Smoking status: Never   Smokeless tobacco: Never  Vaping Use   Vaping Use: Never used  Substance and Sexual Activity   Alcohol use: No   Drug use: No   Sexual activity: Not on file  Other Topics Concern   Not on file  Social History Narrative   Not on file   Social Determinants of Health   Financial Resource Strain: Not on file  Food Insecurity:  Not on file  Transportation Needs: Not on file  Physical Activity: Not on file  Stress: Not on file  Social Connections: Not on file    Family History  Adopted: Yes    Health Maintenance  Topic Date Due   OPHTHALMOLOGY EXAM  Never done   HIV Screening  Never done   Hepatitis C Screening  Never done   COVID-19 Vaccine (3 - Booster for Moderna series) 05/27/2020   INFLUENZA VACCINE  07/01/2021   HEMOGLOBIN A1C  11/28/2021   FOOT EXAM  05/29/2022   TETANUS/TDAP  12/16/2023   PNEUMOCOCCAL POLYSACCHARIDE VACCINE AGE 21-64 HIGH RISK  Completed   Pneumococcal Vaccine 18-59 Years old  Aged Out   HPV VACCINES  Aged Out     ----------------------------------------------------------------------------------------------------------------------------------------------------------------------------------------------------------------- Physical Exam BP (!) 138/96 (BP Location: Left Arm, Patient Position: Sitting, Cuff Size: Normal)   Pulse 79   Temp 98.1 F (36.7 C)   Ht 5\' 11"  (1.803 m)   Wt 208 lb (94.3 kg)   SpO2 100%   BMI 29.01 kg/m   Physical Exam Constitutional:      Appearance: Normal appearance.  HENT:     Head: Normocephalic and atraumatic.  Eyes:     General: No scleral icterus. Cardiovascular:     Rate and  Rhythm: Normal rate and regular rhythm.  Pulmonary:     Effort: Pulmonary effort is normal.     Breath sounds: Normal breath sounds.  Musculoskeletal:     Cervical back: Neck supple.  Neurological:     General: No focal deficit present.     Mental Status: He is alert.  Psychiatric:        Mood and Affect: Mood normal.        Behavior: Behavior normal.    ------------------------------------------------------------------------------------------------------------------------------------------------------------------------------------------------------------------- Assessment and Plan  Essential hypertension Diastolic blood pressure is mildly elevated.  He  will continue to work on dietary changes as well as low sodium intake.  Continue lisinopril at current strength.  Type 2 diabetes mellitus without complication (HCC) Blood sugars at home have been better controlled with combination of metformin and Trulicity.  He will continue these at current strength.Return in about 8 weeks (around 09/18/2021) for DM/HTN.   Meds ordered this encounter  Medications   Dulaglutide (TRULICITY) 1.5 MG/0.5ML SOPN    Sig: Inject 1.5 mg into the skin once a week. Start after using 0.75mg  dose x4 weeks.    Dispense:  2 mL    Refill:  3    Return in about 8 weeks (around 09/18/2021) for DM/HTN.    This visit occurred during the SARS-CoV-2 public health emergency.  Safety protocols were in place, including screening questions prior to the visit, additional usage of staff PPE, and extensive cleaning of exam room while observing appropriate contact time as indicated for disinfecting solutions.  Denies

## 2021-07-24 NOTE — Assessment & Plan Note (Signed)
Diastolic blood pressure is mildly elevated.  He will continue to work on dietary changes as well as low sodium intake.  Continue lisinopril at current strength.

## 2021-07-24 NOTE — Assessment & Plan Note (Signed)
Blood sugars at home have been better controlled with combination of metformin and Trulicity.  He will continue these at current strength.Return in about 8 weeks (around 09/18/2021) for DM/HTN.

## 2021-09-18 ENCOUNTER — Ambulatory Visit: Payer: 59 | Admitting: Family Medicine

## 2021-12-04 ENCOUNTER — Ambulatory Visit: Payer: 59 | Admitting: Family Medicine

## 2022-08-07 ENCOUNTER — Ambulatory Visit: Payer: 59 | Admitting: Family Medicine

## 2022-08-20 ENCOUNTER — Telehealth: Payer: Self-pay

## 2022-08-20 ENCOUNTER — Ambulatory Visit: Payer: 59 | Admitting: Family Medicine

## 2022-08-20 ENCOUNTER — Encounter: Payer: Self-pay | Admitting: Family Medicine

## 2022-08-20 VITALS — BP 164/112 | HR 86 | Ht 71.0 in | Wt 204.0 lb

## 2022-08-20 DIAGNOSIS — I10 Essential (primary) hypertension: Secondary | ICD-10-CM

## 2022-08-20 DIAGNOSIS — E119 Type 2 diabetes mellitus without complications: Secondary | ICD-10-CM

## 2022-08-20 LAB — POCT GLYCOSYLATED HEMOGLOBIN (HGB A1C): HbA1c, POC (controlled diabetic range): 10.5 % — AB (ref 0.0–7.0)

## 2022-08-20 LAB — POCT UA - MICROALBUMIN
Albumin/Creatinine Ratio, Urine, POC: <30
Creatinine, POC: 200 mg/dL
Microalbumin Ur, POC: 30 mg/L

## 2022-08-20 MED ORDER — TOUJEO SOLOSTAR 300 UNIT/ML ~~LOC~~ SOPN: 10.0000 [IU] | PEN_INJECTOR | Freq: Every day | SUBCUTANEOUS | 1 refills | Status: DC

## 2022-08-20 MED ORDER — METFORMIN HCL ER 500 MG PO TB24: 1000.0000 mg | ORAL_TABLET | Freq: Every day | ORAL | 1 refills | Status: DC

## 2022-08-20 MED ORDER — TRULICITY 1.5 MG/0.5ML ~~LOC~~ SOAJ: 1.5000 mg | SUBCUTANEOUS | 3 refills | Status: DC

## 2022-08-20 MED ORDER — BD PEN NEEDLE NANO U/F 32G X 4 MM MISC: 1 refills | Status: DC

## 2022-08-20 MED ORDER — LISINOPRIL 10 MG PO TABS: 10.0000 mg | ORAL_TABLET | Freq: Every day | ORAL | 1 refills | Status: DC

## 2022-08-20 NOTE — Patient Instructions (Addendum)
Continue metformin and trulicity Add 10 units of toujeo daily.  Continue dietary and exercise change.  Return in 2 weeks for BP check and fasting labs.

## 2022-08-20 NOTE — Addendum Note (Signed)
Addended by: Peggye Ley on: 08/20/2022 11:05 AM   Modules accepted: Orders

## 2022-08-20 NOTE — Assessment & Plan Note (Signed)
BP elevated.  Adding lisinopril back on.  Return in 2 weeks for BP check and fasting labs.

## 2022-08-20 NOTE — Telephone Encounter (Signed)
Chart has been updated.

## 2022-08-20 NOTE — Assessment & Plan Note (Signed)
Diabetes is poorly controlled at this time.  Continue trulicity and metformin.  Adding 10 units of toujeo as well.  F/u in 3 months.

## 2022-08-20 NOTE — Progress Notes (Signed)
Dale Scott - 41 y.o. male MRN [id number]  Date of birth: 06/22/1981  Subjective Chief Complaint  Patient presents with   Diabetes   Hypertension    HPI Dale Scott is a 41 y.o. male here today for follow up.  He has not been here in >1 year.   He has been off of lisinopril for a couple of months.  BP is elevated today.  Denies symptoms related to HTN including chest pain, shortness of breath ,palpitations, headache or vision change.   His diabetes has been treated with trulicity and metformin historically.  He didn't take this for several months but did restart this about 1 month ago.  He was having increased thirst and urination.  This has improved since restarting medication.  CBG at home in the 200's recently.   ROS:  A comprehensive ROS was completed and negative except as noted per HPI  Allergies  Allergen Reactions   Atorvastatin Nausea And Vomiting and Other (See Comments)    Severe GI upset   Other Rash    Cinnamon    Sulfa Antibiotics     Childhood allergy    Past Medical History:  Diagnosis Date   COVID-19 12/2020   Diabetes mellitus without complication (HCC)    Hypertension    Migraine     History reviewed. No pertinent surgical history.  Social History   Socioeconomic History   Marital status: Married    Spouse name: Not on file   Number of children: Not on file   Years of education: Not on file   Highest education level: Not on file  Occupational History   Not on file  Tobacco Use   Smoking status: Never   Smokeless tobacco: Never  Vaping Use   Vaping Use: Never used  Substance and Sexual Activity   Alcohol use: No   Drug use: No   Sexual activity: Not on file  Other Topics Concern   Not on file  Social History Narrative   Not on file   Social Determinants of Health   Financial Resource Strain: Not on file  Food Insecurity: Not on file  Transportation Needs: Not on file  Physical Activity: Not on file  Stress: Not on file   Social Connections: Not on file    Family History  Adopted: Yes    Health Maintenance  Topic Date Due   OPHTHALMOLOGY EXAM  Never done   HIV Screening  Never done   Diabetic kidney evaluation - Urine ACR  Never done   Hepatitis C Screening  Never done   COVID-19 Vaccine (3 - Moderna series) 02/22/2020   HEMOGLOBIN A1C  11/28/2021   Diabetic kidney evaluation - GFR measurement  05/29/2022   INFLUENZA VACCINE  03/01/2023 (Originally 07/01/2022)   FOOT EXAM  08/21/2023   TETANUS/TDAP  12/16/2023   HPV VACCINES  Aged Out     ----------------------------------------------------------------------------------------------------------------------------------------------------------------------------------------------------------------- Physical Exam BP (!) 164/112 (BP Location: Left Arm, Patient Position: Sitting, Cuff Size: Normal)   Pulse 86   Ht 5\' 11"  (1.803 m)   Wt 204 lb (92.5 kg)   SpO2 100%   BMI 28.45 kg/m   Physical Exam Constitutional:      Appearance: Normal appearance.  Eyes:     General: No scleral icterus. Cardiovascular:     Rate and Rhythm: Normal rate and regular rhythm.  Pulmonary:     Effort: Pulmonary effort is normal.     Breath sounds: Normal breath sounds.  Musculoskeletal:  Cervical back: Neck supple.  Neurological:     Mental Status: He is alert.  Psychiatric:        Mood and Affect: Mood normal.        Behavior: Behavior normal.     ------------------------------------------------------------------------------------------------------------------------------------------------------------------------------------------------------------------- Assessment and Plan  Type 2 diabetes mellitus without complication ([place]) Diabetes is poorly controlled at this time.  Continue trulicity and metformin.  Adding 10 units of toujeo as well.  F/u in 3 months.   Essential hypertension BP elevated.  Adding lisinopril back on.  Return in 2 weeks for BP  check and fasting labs.    Meds ordered this encounter  Medications   insulin glargine, 1 Unit Dial, (TOUJEO SOLOSTAR) 300 UNIT/ML Solostar Pen    Sig: Inject 10 Units into the skin daily.    Dispense:  3 mL    Refill:  1   metFORMIN (GLUCOPHAGE XR) 500 MG 24 hr tablet    Sig: Take 2 tablets (1,000 mg total) by mouth daily with breakfast.    Dispense:  180 tablet    Refill:  1   Dulaglutide (TRULICITY) 1.5 ZJ/6.7HA SOPN    Sig: Inject 1.5 mg into the skin once a week. Start after using 0.75mg  dose x4 weeks.    Dispense:  2 mL    Refill:  3   lisinopril (ZESTRIL) 10 MG tablet    Sig: Take 1 tablet (10 mg total) by mouth daily.    Dispense:  90 tablet    Refill:  1   Insulin Pen Needle (BD PEN NEEDLE NANO U/F) 32G X 4 MM MISC    Sig: Use to inject insulin daily    Dispense:  90 each    Refill:  1    Return in about 3 months (around 11/19/2022) for HTN/T2DM.    This visit occurred during the SARS-CoV-2 public health emergency.  Safety protocols were in place, including screening questions prior to the visit, additional usage of staff PPE, and extensive cleaning of exam room while observing appropriate contact time as indicated for disinfecting solutions.

## 2022-09-01 ENCOUNTER — Encounter: Payer: Self-pay | Admitting: Family Medicine

## 2022-09-03 ENCOUNTER — Ambulatory Visit (INDEPENDENT_AMBULATORY_CARE_PROVIDER_SITE_OTHER): Payer: 59 | Admitting: Family Medicine

## 2022-09-03 VITALS — BP 147/93 | HR 79

## 2022-09-03 DIAGNOSIS — I10 Essential (primary) hypertension: Secondary | ICD-10-CM | POA: Diagnosis not present

## 2022-09-03 LAB — CBC WITH DIFFERENTIAL/PLATELET
Absolute Monocytes: 570 cells/uL (ref 200–950)
Basophils Absolute: 31 cells/uL (ref 0–200)
Eosinophils Absolute: 93 cells/uL (ref 15–500)
HCT: 44.1 % (ref 38.5–50.0)
Hemoglobin: 15.2 g/dL (ref 13.2–17.1)
Lymphs Abs: 1910 cells/uL (ref 850–3900)
MCHC: 34.5 g/dL (ref 32.0–36.0)
MPV: 12.5 fL (ref 7.5–12.5)
Monocytes Relative: 9.2 %
Platelets: 177 10*3/uL (ref 140–400)
RDW: 13.2 % (ref 11.0–15.0)
Total Lymphocyte: 30.8 %

## 2022-09-03 NOTE — Progress Notes (Signed)
Medical screening examination/treatment was performed by qualified clinical staff member and as supervising physician I was immediately available for consultation/collaboration. I have reviewed documentation and agree with assessment and plan.  Lataja Newland, DO  

## 2022-09-03 NOTE — Progress Notes (Signed)
   Established Patient Office Visit  Subjective   Patient ID: Dale Scott, male    DOB: 1981/07/29  Age: 41 y.o. MRN: [id number]  Chief Complaint  Patient presents with   Hypertension    HPI  Dale Scott is here for blood pressure check. Denies chest pain, shortness of breath or dizziness. He just increased the lisinopril to 20 mg this morning.   ROS    Objective:     BP (!) 147/93   Pulse 79   SpO2 100%    Physical Exam   No results found for any visits on 09/03/22.    The 10-year ASCVD risk score (Arnett DK, et al., 2019) is: 10.9%    Assessment & Plan:  Hypertension - Per Dr Zigmund Daniel, stay on the lisinopril 20 mg daily and check blood pressure at home and notify us with readings. Fasting labs today.   Problem List Items Addressed This Visit       Unprioritized   Essential hypertension - Primary    No follow-ups on file.    Lavell Luster, [place]

## 2022-09-04 LAB — LIPID PANEL W/REFLEX DIRECT LDL
Cholesterol: 211 mg/dL — ABNORMAL HIGH (ref <200)
HDL: 40 mg/dL (ref >=40)
LDL Cholesterol (Calc): 144 mg/dL (calc) — ABNORMAL HIGH
Non-HDL Cholesterol (Calc): 171 mg/dL (calc) — ABNORMAL HIGH (ref <130)
Total CHOL/HDL Ratio: 5.3 (calc) — ABNORMAL HIGH (ref <5.0)
Triglycerides: 143 mg/dL (ref <150)

## 2022-09-04 LAB — COMPLETE METABOLIC PANEL WITH GFR
AG Ratio: 2 (calc) (ref 1.0–2.5)
ALT: 12 U/L (ref 9–46)
AST: 12 U/L (ref 10–40)
Albumin: 4.7 g/dL (ref 3.6–5.1)
Alkaline phosphatase (APISO): 50 U/L (ref 36–130)
BUN: 19 mg/dL (ref 7–25)
CO2: 25 mmol/L (ref 20–32)
Calcium: 9.6 mg/dL (ref 8.6–10.3)
Chloride: 104 mmol/L (ref 98–110)
Creat: 0.8 mg/dL (ref 0.60–1.29)
Globulin: 2.4 g/dL (calc) (ref 1.9–3.7)
Glucose, Bld: 115 mg/dL — ABNORMAL HIGH (ref 65–99)
Potassium: 4.7 mmol/L (ref 3.5–5.3)
Sodium: 138 mmol/L (ref 135–146)
Total Bilirubin: 0.6 mg/dL (ref 0.2–1.2)
Total Protein: 7.1 g/dL (ref 6.1–8.1)
eGFR: 114 mL/min/{1.73_m2} (ref >=60)

## 2022-09-04 LAB — CBC WITH DIFFERENTIAL/PLATELET
Basophils Relative: 0.5 %
Eosinophils Relative: 1.5 %
MCH: 30.3 pg (ref 27.0–33.0)
MCV: 87.8 fL (ref 80.0–100.0)
Neutro Abs: 3596 cells/uL (ref 1500–7800)
Neutrophils Relative %: 58 %
RBC: 5.02 10*6/uL (ref 4.20–5.80)
WBC: 6.2 10*3/uL (ref 3.8–10.8)

## 2022-09-04 LAB — TSH: TSH: 1.98 mIU/L (ref 0.40–4.50)

## 2022-09-09 MED ORDER — ROSUVASTATIN CALCIUM 10 MG PO TABS: 10.0000 mg | ORAL_TABLET | Freq: Every day | ORAL | 3 refills | Status: DC

## 2022-11-03 ENCOUNTER — Telehealth: Payer: Self-pay | Admitting: General Practice

## 2022-11-03 NOTE — Telephone Encounter (Signed)
Transition Care Management Unsuccessful Follow-up Telephone Call  Date of discharge and from where:  11/02/22 from [facility]  Attempts:  1st Attempt  Reason for unsuccessful TCM follow-up call:  Voice mail full

## 2022-11-04 NOTE — Telephone Encounter (Signed)
Transition Care Management Unsuccessful Follow-up Telephone Call  Date of discharge and from where:  11/02/22 from [facility]  Attempts:  2nd Attempt  Reason for unsuccessful TCM follow-up call:  Voice mail full

## 2022-11-04 NOTE — Telephone Encounter (Signed)
Pt called back and he does not want to f/u with pcp at this time and will be seeing a GI Dr for colitis and Gi bleed

## 2022-11-21 ENCOUNTER — Ambulatory Visit: Payer: 59 | Admitting: Family Medicine

## 2022-12-11 ENCOUNTER — Other Ambulatory Visit: Payer: Self-pay

## 2022-12-11 MED ORDER — LISINOPRIL 20 MG PO TABS: 20.0000 mg | ORAL_TABLET | Freq: Every day | ORAL | 1 refills | Status: DC

## 2022-12-15 ENCOUNTER — Encounter: Payer: Self-pay | Admitting: Family Medicine

## 2022-12-15 ENCOUNTER — Ambulatory Visit (INDEPENDENT_AMBULATORY_CARE_PROVIDER_SITE_OTHER): Payer: 59 | Admitting: Family Medicine

## 2022-12-15 VITALS — BP 147/87 | HR 87 | Ht 71.0 in | Wt 212.0 lb

## 2022-12-15 DIAGNOSIS — E119 Type 2 diabetes mellitus without complications: Secondary | ICD-10-CM | POA: Diagnosis not present

## 2022-12-15 DIAGNOSIS — I10 Essential (primary) hypertension: Secondary | ICD-10-CM | POA: Diagnosis not present

## 2022-12-15 DIAGNOSIS — K529 Noninfective gastroenteritis and colitis, unspecified: Secondary | ICD-10-CM | POA: Insufficient documentation

## 2022-12-15 DIAGNOSIS — Z23 Encounter for immunization: Secondary | ICD-10-CM | POA: Diagnosis not present

## 2022-12-15 LAB — POCT GLYCOSYLATED HEMOGLOBIN (HGB A1C): Hemoglobin A1C: 8.9 % — AB (ref 4.0–5.6)

## 2022-12-15 MED ORDER — TRULICITY 3 MG/0.5ML ~~LOC~~ SOAJ: 3.0000 mg | SUBCUTANEOUS | 1 refills | Status: AC

## 2022-12-15 MED ORDER — NEOMYCIN-POLYMYXIN-HC 3.5-10000-1 OT SOLN: 4.0000 [drp] | Freq: Four times a day (QID) | OTIC | 0 refills | Status: DC

## 2022-12-15 NOTE — Addendum Note (Signed)
Addended by: Cline Crock on: 12/15/2022 11:28 AM   Modules accepted: Orders

## 2022-12-15 NOTE — Patient Instructions (Addendum)
A1c is improving.  Let's increase your trulicity to 3mg /week.  Continue to work on dietary changes.   Follow up in 4 months.

## 2022-12-15 NOTE — Assessment & Plan Note (Signed)
A1c has improved some since last visit.  Continue to work on dietary changes.  Continue titration of trulicity to 3mg  week.

## 2022-12-15 NOTE — Progress Notes (Signed)
Dale Scott - 42 y.o. male MRN [id number]  Date of birth: Apr 05, 1981  Subjective Chief Complaint  Patient presents with   Follow-up    HPI Dale Scott is a 42 y.o. male here today for follow up visit.   He reports that he is doing pretty good today..   Since last visit he was seen in the ED with colitis.  He does have an appt. Scheduled for colonoscopy.   Continues on lisinopril for management of HTN.  He is tolerating this well.  He denies side effects at this time.  BP is elevated today.  He has not had symptoms including chest pain, shortness of breath,. palpitations, headache or vision changes.   Diabetes is managed with toujeo 10 units, metformin and trulicity.  He is doing well with combination of current medications.  Glucose at home has been improved compared to previous readings.    ROS:  A comprehensive ROS was completed and negative except as noted per HPI  Allergies  Allergen Reactions   Atorvastatin Nausea And Vomiting and Other (See Comments)    Severe GI upset   Other Rash    Cinnamon    Sulfa Antibiotics     Childhood allergy    Past Medical History:  Diagnosis Date   COVID-19 12/2020   Diabetes mellitus without complication ([place])    Hypertension    Migraine     History reviewed. No pertinent surgical history.  Social History   Socioeconomic History   Marital status: Married    Spouse name: Not on file   Number of children: Not on file   Years of education: Not on file   Highest education level: Not on file  Occupational History   Not on file  Tobacco Use   Smoking status: Never   Smokeless tobacco: Never  Vaping Use   Vaping Use: Never used  Substance and Sexual Activity   Alcohol use: No   Drug use: No   Sexual activity: Not on file  Other Topics Concern   Not on file  Social History Narrative   Not on file   Social Determinants of Health   Financial Resource Strain: Not on file  Food Insecurity: Not on file  Transportation  Needs: Not on file  Physical Activity: Not on file  Stress: Not on file  Social Connections: Not on file    Family History  Adopted: Yes    Health Maintenance  Topic Date Due   OPHTHALMOLOGY EXAM  01/01/2023 (Originally 10/04/1990)   COVID-19 Vaccine (3 - 2023-24 season) 01/01/2023 (Originally 08/01/2022)   INFLUENZA VACCINE  03/01/2023 (Originally 07/01/2022)   Hepatitis C Screening  08/21/2023 (Originally 10/04/1998)   HIV Screening  08/21/2023 (Originally 10/05/1995)   HEMOGLOBIN A1C  06/15/2023   Diabetic kidney evaluation - Urine ACR  08/21/2023   FOOT EXAM  08/21/2023   Diabetic kidney evaluation - eGFR measurement  09/04/2023   DTaP/Tdap/Td (2 - Td or Tdap) 12/16/2023   HPV VACCINES  Aged Out     ----------------------------------------------------------------------------------------------------------------------------------------------------------------------------------------------------------------- Physical Exam BP (!) 160/94   Pulse 82   Ht 5\' 11"  (1.803 m)   Wt 212 lb (96.2 kg)   SpO2 99%   BMI 29.57 kg/m   Physical Exam Constitutional:      Appearance: Normal appearance.  HENT:     Head: Normocephalic and atraumatic.  Eyes:     General: No scleral icterus. Cardiovascular:     Rate and Rhythm: Normal rate and regular rhythm.  Pulmonary:     Effort: Pulmonary effort is normal.     Breath sounds: Normal breath sounds.  Musculoskeletal:     Cervical back: Neck supple.  Neurological:     Mental Status: He is alert.  Psychiatric:        Mood and Affect: Mood normal.        Behavior: Behavior normal.     ------------------------------------------------------------------------------------------------------------------------------------------------------------------------------------------------------------------- Assessment and Plan  Type 2 diabetes mellitus without complication (HCC) O3J has improved some since last visit.  Continue to work on dietary  changes.  Continue titration of trulicity to 3mg  week.    Essential hypertension BP is elevated today.  Return in 2 weeks for BP check.  If this remains elevated we'll plan to increase lisinopril to 40mg .    Colitis Overall this is much improved.  Still with some mild L sided intermittent pain. Has upcoming colonscopy.    Meds ordered this encounter  Medications   DISCONTD: neomycin-polymyxin-hydrocortisone (CORTISPORIN) OTIC solution    Sig: Place 4 drops into the right ear 4 (four) times daily.    Dispense:  10 mL    Refill:  0   Dulaglutide (TRULICITY) 3 KK/9.3GH SOPN    Sig: Inject 3 mg as directed once a week.    Dispense:  6 mL    Refill:  1    Return in about 4 months (around 04/15/2023) for F/u T2DM.    This visit occurred during the SARS-CoV-2 public health emergency.  Safety protocols were in place, including screening questions prior to the visit, additional usage of staff PPE, and extensive cleaning of exam room while observing appropriate contact time as indicated for disinfecting solutions.

## 2022-12-15 NOTE — Assessment & Plan Note (Signed)
Overall this is much improved.  Still with some mild L sided intermittent pain. Has upcoming colonscopy.

## 2022-12-15 NOTE — Assessment & Plan Note (Signed)
BP is elevated today.  Return in 2 weeks for BP check.  If this remains elevated we'll plan to increase lisinopril to 40mg .

## 2022-12-16 ENCOUNTER — Encounter: Payer: Self-pay | Admitting: Family Medicine

## 2022-12-16 MED ORDER — DEXCOM G7 RECEIVER DEVI: 0 refills | Status: AC

## 2022-12-16 MED ORDER — DEXCOM G7 SENSOR MISC: 99 refills | Status: DC

## 2022-12-18 ENCOUNTER — Other Ambulatory Visit: Payer: Self-pay

## 2022-12-18 DIAGNOSIS — I10 Essential (primary) hypertension: Secondary | ICD-10-CM

## 2022-12-18 DIAGNOSIS — E119 Type 2 diabetes mellitus without complications: Secondary | ICD-10-CM

## 2022-12-18 MED ORDER — LISINOPRIL 20 MG PO TABS: 20.0000 mg | ORAL_TABLET | Freq: Every day | ORAL | 1 refills | Status: DC

## 2022-12-24 ENCOUNTER — Other Ambulatory Visit: Payer: Self-pay

## 2022-12-24 MED ORDER — BD PEN NEEDLE NANO U/F 32G X 4 MM MISC: 1 refills | Status: DC

## 2022-12-25 ENCOUNTER — Other Ambulatory Visit: Payer: Self-pay

## 2022-12-25 MED ORDER — BD PEN NEEDLE NANO U/F 32G X 4 MM MISC: 1 refills | Status: DC

## 2023-01-06 ENCOUNTER — Telehealth: Payer: Self-pay

## 2023-01-06 NOTE — Telephone Encounter (Addendum)
Initiated Prior authorization [id number] [place] Receiver device Via: Covermymeds Case/Key:[id number] Status: approved  as of 01/06/23 Reason: approved through 01/07/2024. Notified Pt via: Mychart

## 2023-01-15 ENCOUNTER — Other Ambulatory Visit: Payer: Self-pay | Admitting: Family Medicine

## 2023-01-15 ENCOUNTER — Encounter: Payer: Self-pay | Admitting: Family Medicine

## 2023-01-28 ENCOUNTER — Other Ambulatory Visit: Payer: Self-pay

## 2023-01-28 DIAGNOSIS — E119 Type 2 diabetes mellitus without complications: Secondary | ICD-10-CM

## 2023-01-28 MED ORDER — DEXCOM G7 SENSOR MISC: 99 refills | Status: DC

## 2023-01-28 MED ORDER — DEXCOM G7 SENSOR MISC: 3 refills | Status: DC

## 2023-02-04 ENCOUNTER — Other Ambulatory Visit: Payer: Self-pay

## 2023-02-04 DIAGNOSIS — E119 Type 2 diabetes mellitus without complications: Secondary | ICD-10-CM

## 2023-02-04 MED ORDER — METFORMIN HCL ER 500 MG PO TB24: 1000.0000 mg | ORAL_TABLET | Freq: Every day | ORAL | 1 refills | Status: DC

## 2023-03-23 ENCOUNTER — Other Ambulatory Visit: Payer: Self-pay | Admitting: Family Medicine

## 2023-04-15 ENCOUNTER — Ambulatory Visit: Payer: 59 | Admitting: Family Medicine

## 2023-06-01 ENCOUNTER — Other Ambulatory Visit: Payer: Self-pay | Admitting: Family Medicine

## 2023-06-02 NOTE — Telephone Encounter (Signed)
Pls contact the pt to schedule DM appt with Dr. Ashley Royalty. Thanks

## 2023-06-02 NOTE — Telephone Encounter (Signed)
Patient is scheduled for July 16th for DM follow up appointment

## 2023-06-16 ENCOUNTER — Ambulatory Visit: Payer: 59 | Admitting: Family Medicine

## 2023-06-16 ENCOUNTER — Encounter: Payer: Self-pay | Admitting: Family Medicine

## 2023-06-24 ENCOUNTER — Ambulatory Visit: Payer: 59 | Admitting: Internal Medicine

## 2023-06-24 ENCOUNTER — Encounter: Payer: Self-pay | Admitting: Internal Medicine

## 2023-06-24 VITALS — BP 154/100 | HR 83 | Temp 98.5°F | Ht 71.0 in | Wt 216.8 lb

## 2023-06-24 DIAGNOSIS — Z7984 Long term (current) use of oral hypoglycemic drugs: Secondary | ICD-10-CM

## 2023-06-24 DIAGNOSIS — F320 Major depressive disorder, single episode, mild: Secondary | ICD-10-CM

## 2023-06-24 DIAGNOSIS — I10 Essential (primary) hypertension: Secondary | ICD-10-CM

## 2023-06-24 DIAGNOSIS — F411 Generalized anxiety disorder: Secondary | ICD-10-CM | POA: Diagnosis not present

## 2023-06-24 DIAGNOSIS — E119 Type 2 diabetes mellitus without complications: Secondary | ICD-10-CM | POA: Diagnosis not present

## 2023-06-24 MED ORDER — TOUJEO SOLOSTAR 300 UNIT/ML ~~LOC~~ SOPN
PEN_INJECTOR | SUBCUTANEOUS | Status: DC
Start: 2023-06-24 — End: 2023-07-23

## 2023-06-24 MED ORDER — BUPROPION HCL 100 MG PO TABS
100.0000 mg | ORAL_TABLET | Freq: Two times a day (BID) | ORAL | 0 refills | Status: DC
Start: 2023-06-24 — End: 2023-07-23

## 2023-06-24 MED ORDER — LISINOPRIL-HYDROCHLOROTHIAZIDE 20-12.5 MG PO TABS
1.0000 | ORAL_TABLET | Freq: Every day | ORAL | 0 refills | Status: DC
Start: 2023-06-24 — End: 2023-07-23

## 2023-06-24 MED ORDER — DEXCOM G7 SENSOR MISC
3 refills | Status: AC
Start: 2023-06-24 — End: ?

## 2023-06-24 NOTE — Progress Notes (Signed)
[facility] [address] [place] [address] Dept: [phone] Dept Fax: [phone]  New Patient Office Visit  Subjective:   Dale Scott 1981-02-02 06/24/2023  Chief Complaint  Patient presents with   Establish Care   Medication Refill    Discuss depression    HPI: [name] presents today to establish care at [facility] [employer]. Introduced to Publishing rights manager role and practice setting.  All questions answered.  Concerns: See below   Discussed the use of AI scribe software for clinical note transcription with the patient, who gave verbal consent to proceed.  History of Present Illness   The patient, a paramedic, presents with persistently elevated blood pressure despite being on Lisinopril 20 mg. They report that their blood pressure has been consistently high for a while, and they have been monitoring it regularly at work.   The patient also has a history of diabetes and is currently on Metformin XR 500mg  (2 tabs daily), Trulicity 1.5 mg and Toujeo 15 units once a day. However, they report that their blood sugar levels are still high, typically around 200, although this is an improvement from previous readings in the 400s. They mention experiencing symptoms such as sleepiness, which they attribute to their high blood sugar levels.   In addition to their physical health concerns, the patient also reports experiencing symptoms of depression and anxiety. They have been treated for depression in the past but have recently been experiencing increased anxiety and irritability, particularly in crowded or stressful situations. They express a desire to try a different medication for their depression due to sexual side effects they experienced with Lexapro, a medication they were previously on.  Denies SI/HI.     The patient is adopted, unknown family hx.      06/24/2023    8:23 AM 08/20/2022    10:28 AM 07/24/2021    9:35 AM  Depression screen PHQ 2/9  Decreased Interest 0 0 0  Down, Depressed, Hopeless 0 0 0  PHQ - 2 Score 0 0 0  Altered sleeping 0    Tired, decreased energy 2    Change in appetite 2    Feeling bad or failure about yourself  0    Trouble concentrating 3    Moving slowly or fidgety/restless 0    Suicidal thoughts 0    PHQ-9 Score 7    Difficult doing work/chores Not difficult at all        06/24/2023    8:23 AM  GAD 7 : Generalized Anxiety Score  Nervous, Anxious, on Edge 2  Control/stop worrying 0  Worry too much - different things 0  Trouble relaxing 1  Restless 1  Easily annoyed or irritable 2  Afraid - awful might happen 0  Total GAD 7 Score 6  Anxiety Difficulty Not difficult at all     The following portions of the patient's history were reviewed and updated as appropriate: past medical history, past surgical history, family history, social history, allergies, medications, and problem list.   Patient Active Problem List   Diagnosis Date Noted   Current mild episode of major depressive disorder without prior episode (HCC) 06/24/2023   Generalized anxiety disorder 06/24/2023   Colitis 12/15/2022   Essential hypertension 05/29/2021   Umbilical hernia 05/29/2021   Type 2 diabetes mellitus without complication (HCC) 03/25/2016   Past Medical History:  Diagnosis Date   COVID-19 12/2020   Diabetes mellitus without complication (HCC)  Hypertension    Migraine    History reviewed. No pertinent surgical history. Family History  Adopted: Yes   Outpatient Medications Prior to Visit  Medication Sig Dispense Refill   Continuous Blood Gluc Receiver (DEXCOM G7 RECEIVER) DEVI Check continuous blood glucose daily. 1 each 0   Insulin Pen Needle (BD PEN NEEDLE NANO U/F) 32G X 4 MM MISC Use to inject insulin daily 100 each 1   metFORMIN (GLUCOPHAGE XR) 500 MG 24 hr tablet Take 2 tablets (1,000 mg total) by mouth daily with breakfast. 180 tablet 1    omeprazole (PRILOSEC) 10 MG capsule Take 10 mg by mouth daily.     rosuvastatin (CRESTOR) 10 MG tablet Take 1 tablet (10 mg total) by mouth daily. 90 tablet 3   acetaminophen (TYLENOL) 500 MG tablet Take 500 mg by mouth every 6 (six) hours as needed for mild pain, fever or headache.     Continuous Blood Gluc Sensor (DEXCOM G7 SENSOR) MISC Check continuous blood glucose daily. 3 each 3   Dulaglutide (TRULICITY) 1.5 MG/0.5ML SOPN INJECT 1.5 MG INTO THE SKIN ONCE A WEEK. START AFTER USING 0.75MG  DOSE X4 WEEKS. 2 mL 0   insulin glargine, 1 Unit Dial, (TOUJEO SOLOSTAR) 300 UNIT/ML Solostar Pen INJECT 10 UNITS INTO THE SKIN DAILY 3 mL 1   lisinopril (ZESTRIL) 20 MG tablet Take 1 tablet (20 mg total) by mouth daily. 90 tablet 1   No facility-administered medications prior to visit.   Allergies  Allergen Reactions   Atorvastatin Nausea And Vomiting and Other (See Comments)    Severe GI upset   Other Rash    Cinnamon    Misc. Sulfonamide Containing Compounds Rash   Sulfa Antibiotics     Childhood allergy    ROS: A complete ROS was performed with pertinent positives/negatives noted in the HPI. The remainder of the ROS are negative.   Objective:   Today's Vitals   06/24/23 0820 06/24/23 0900  BP: (!) 160/104 (!) 154/100  Pulse: 83   Temp: 98.5 F (36.9 C)   TempSrc: Temporal   SpO2: 99%   Weight: 216 lb 12.8 oz (98.3 kg)   Height: 5\' 11"  (1.803 m)     GENERAL: Well-appearing, in NAD. Well nourished.  SKIN: Pink, warm and dry. No rash.  NECK: Trachea midline. Full ROM w/o pain or tenderness. No lymphadenopathy. No thyromegaly.  RESPIRATORY: Chest wall symmetrical. Respirations even and non-labored. Breath sounds clear to auscultation bilaterally.  CARDIAC: S1, S2 present, regular rate and rhythm. Peripheral pulses 2+ bilaterally.  EXTREMITIES: Without clubbing, cyanosis, or edema.  NEUROLOGIC: No motor or sensory deficits. Steady, even gait. Sensory exam of the foot is normal,  tested with the monofilament. Good pulses, no lesions or ulcers, good peripheral pulses. PSYCH/MENTAL STATUS: Alert, oriented x 3. Cooperative, appropriate mood and affect.   Health Maintenance Due  Topic Date Due   HEMOGLOBIN A1C  06/15/2023    No results found for any visits on 06/24/23.  Assessment & Plan:  Assessment and Plan    Hypertension: Persistent elevation despite Lisinopril 20mg  daily. Discussed adding Hydrochlorothiazide to Lisinopril for better control. -Add Hydrochlorothiazide to current Lisinopril regimen. -Check blood pressure at home and maintain a log for trend analysis. -Follow-up in 4 weeks to assess blood pressure control.  Type 2 Diabetes Mellitus: Persistent hyperglycemia with fasting blood sugars in the 200s despite Trulicity 1.5mg  weekly, Toujeo 15 units daily, and Metformin XR 1000mg  daily. Discussed increasing Trulicity and adjusting Toujeo dose based on fasting  blood sugars. -Increase Trulicity to 3mg  weekly. -Increase Toujeo by 2 units every 2 days until fasting blood sugars are less than 150, with a maximum dose of 50 units daily. -Continue Metformin XR 1000mg  daily. -Order labs including A1c, kidney, liver function, electrolytes, thyroid, and cholesterol. -Check urine for proteinuria.  Depression and Anxiety: History of depression and recent increase in anxiety symptoms. Discussed starting Wellbutrin due to its lack of sexual side effects compared to previous treatment with Lexapro. -Start Wellbutrin 100mg  twice daily. -Follow-up in 4 weeks to assess response to treatment.    Orders Placed This Encounter  Procedures   Comprehensive metabolic panel    Standing Status:   Future    Standing Expiration Date:   06/23/2024   Hemoglobin A1c    Standing Status:   Future    Standing Expiration Date:   06/23/2024   Lipid panel    Standing Status:   Future    Standing Expiration Date:   06/23/2024   TSH    Standing Status:   Future    Standing Expiration  Date:   06/23/2024   Microalbumin / creatinine urine ratio    Standing Status:   Future    Standing Expiration Date:   06/23/2024   Meds ordered this encounter  Medications   lisinopril-hydrochlorothiazide (ZESTORETIC) 20-12.5 MG tablet    Sig: Take 1 tablet by mouth daily.    Dispense:  60 tablet    Refill:  0    Order Specific Question:   Supervising Provider    Answer:   Garnette Gunner [[id number]]   buPROPion (WELLBUTRIN) 100 MG tablet    Sig: Take 1 tablet (100 mg total) by mouth 2 (two) times daily.    Dispense:  120 tablet    Refill:  0    Order Specific Question:   Supervising Provider    Answer:   Garnette Gunner [[id number]]   Continuous Glucose Sensor (DEXCOM G7 SENSOR) MISC    Sig: Check continuous blood glucose daily.    Dispense:  9 each    Refill:  3    Order Specific Question:   Supervising Provider    Answer:   Garnette Gunner [[id number]]   insulin glargine, 1 Unit Dial, (TOUJEO SOLOSTAR) 300 UNIT/ML Solostar Pen    Sig: Inject 15 units into skin once daily. Increase by 2 units every 2 days for fasting blood sugars above 150. Max daily dose 50 units.    Order Specific Question:   Supervising Provider    Answer:   Garnette Gunner [[id number]]    Return in about 4 weeks (around 07/22/2023) for Anxiety/Depression, Hypertension.   Of note, portions of this note may have been created with voice recognition software Physicist, medical). While this note has been edited for accuracy, occasional wrong-word or 'sound-a-like' substitutions may have occurred due to the inherent limitations of voice recognition software.  Salvatore Decent, FNP

## 2023-06-25 ENCOUNTER — Encounter: Payer: Self-pay | Admitting: Internal Medicine

## 2023-06-26 ENCOUNTER — Telehealth: Payer: Self-pay

## 2023-06-26 MED ORDER — TRULICITY 3 MG/0.5ML ~~LOC~~ SOAJ: 3.0000 mg | SUBCUTANEOUS | 2 refills | Status: DC

## 2023-06-26 MED ORDER — TRULICITY 1.5 MG/0.5ML ~~LOC~~ SOAJ: 1.5000 mg | SUBCUTANEOUS | 2 refills | Status: DC

## 2023-06-26 NOTE — Telephone Encounter (Signed)
Patient called stating during his visit it was discussed that his Trulicity 1.5 mg would be increased to 3 ml. Old Rx was discontinued and a new Rx was sent to the pharmacy.  After reviewing after visit summary.

## 2023-06-26 NOTE — Addendum Note (Signed)
Addended by: Mary Sella D on: 06/26/2023 03:35 PM   Modules accepted: Orders

## 2023-06-26 NOTE — Addendum Note (Signed)
Addended by: Mary Sella D on: 06/26/2023 03:22 PM   Modules accepted: Orders

## 2023-06-29 ENCOUNTER — Ambulatory Visit: Payer: 59 | Admitting: Internal Medicine

## 2023-06-29 NOTE — Telephone Encounter (Signed)
Provider aware, thank you 

## 2023-07-01 ENCOUNTER — Encounter (INDEPENDENT_AMBULATORY_CARE_PROVIDER_SITE_OTHER): Payer: Self-pay

## 2023-07-16 ENCOUNTER — Telehealth: Payer: Self-pay

## 2023-07-16 NOTE — Telephone Encounter (Signed)
PA has been submitted to insurance.   Cristion Ribelin (Key: [id number]) PA Case ID #: [id number] Rx #: [id number] Need Help? Call us at [phone] Status sent iconSent to Plan today Drug Insulin Glargine Solostar 300UNIT/ML pen-injectors

## 2023-07-22 ENCOUNTER — Ambulatory Visit: Payer: 59 | Admitting: Internal Medicine

## 2023-07-22 ENCOUNTER — Other Ambulatory Visit (INDEPENDENT_AMBULATORY_CARE_PROVIDER_SITE_OTHER): Payer: 59

## 2023-07-22 ENCOUNTER — Telehealth: Payer: Self-pay | Admitting: Internal Medicine

## 2023-07-22 DIAGNOSIS — E119 Type 2 diabetes mellitus without complications: Secondary | ICD-10-CM

## 2023-07-22 LAB — COMPREHENSIVE METABOLIC PANEL
ALT: 13 U/L (ref 0–53)
AST: 14 U/L (ref 0–37)
Albumin: 4.3 g/dL (ref 3.5–5.2)
Alkaline Phosphatase: 57 U/L (ref 39–117)
BUN: 16 mg/dL (ref 6–23)
CO2: 28 meq/L (ref 19–32)
Calcium: 9.3 mg/dL (ref 8.4–10.5)
Chloride: 103 meq/L (ref 96–112)
Creatinine, Ser: 0.97 mg/dL (ref 0.40–1.50)
GFR: 96.09 mL/min (ref >60.00)
Glucose, Bld: 179 mg/dL — ABNORMAL HIGH (ref 70–99)
Potassium: 4.6 mEq/L (ref 3.5–5.1)
Sodium: 137 meq/L (ref 135–145)
Total Bilirubin: 0.4 mg/dL (ref 0.2–1.2)
Total Protein: 7 g/dL (ref 6.0–8.3)

## 2023-07-22 LAB — HEMOGLOBIN A1C: Hgb A1c MFr Bld: 7.5 % — ABNORMAL HIGH (ref 4.6–6.5)

## 2023-07-22 LAB — MICROALBUMIN / CREATININE URINE RATIO
Creatinine,U: 147.4 mg/dL
Microalb Creat Ratio: 0.5 mg/g (ref 0.0–30.0)
Microalb, Ur: <0.7 mg/dL (ref 0.0–1.9)

## 2023-07-22 LAB — LIPID PANEL
Cholesterol: 163 mg/dL (ref 0–200)
HDL: 31.7 mg/dL — ABNORMAL LOW (ref >39.00)
NonHDL: 130.81
Total CHOL/HDL Ratio: 5
Triglycerides: 274 mg/dL — ABNORMAL HIGH (ref 0.0–149.0)
VLDL: 54.8 mg/dL — ABNORMAL HIGH (ref 0.0–40.0)

## 2023-07-22 LAB — LDL CHOLESTEROL, DIRECT: Direct LDL: 95 mg/dL

## 2023-07-22 NOTE — Progress Notes (Unsigned)
[facility] [address] [place] [address] Dept: [phone] Dept Fax: [phone]  Acute Care Office Visit  Subjective:   Dale Scott September 04, 1981 07/23/2023  No chief complaint on file.   HPI: HYPERTENSION: Dale Scott presents for the medical management of hypertension.  Patient's current hypertension medication regimen is: *** Patient is *** currently taking prescribed medications for HTN.  Patient is *** regularly keeping a check on BP at home.  Patient is *** adhering to low salt diet.  Denies headache, dizziness, CP, SHOB, vision changes.   BP Readings from Last 3 Encounters:  06/24/23 (!) 154/100  12/15/22 (!) 147/87  09/03/22 (!) 147/93   DEPRESSION and ANXIETY: Dale Scott presents for the medical management of depression and anxiety.  Current medication regimen: *** Counseling: *** Well controlled: *** Denies ***SI/HI.     06/24/2023    8:23 AM 08/20/2022   10:28 AM 07/24/2021    9:35 AM  PHQ9 SCORE ONLY  PHQ-9 Total Score 7 0 0      06/24/2023    8:23 AM  GAD 7 : Generalized Anxiety Score  Nervous, Anxious, on Edge 2  Control/stop worrying 0  Worry too much - different things 0  Trouble relaxing 1  Restless 1  Easily annoyed or irritable 2  Afraid - awful might happen 0  Total GAD 7 Score 6  Anxiety Difficulty Not difficult at all        The following portions of the patient's history were reviewed and updated as appropriate: past medical history, past surgical history, family history, social history, allergies, medications, and problem list.   Patient Active Problem List   Diagnosis Date Noted   Current mild episode of major depressive disorder without prior episode (HCC) 06/24/2023   Generalized anxiety disorder 06/24/2023   Colitis 12/15/2022   Essential hypertension 05/29/2021   Umbilical hernia 05/29/2021   Type 2 diabetes mellitus without complication (HCC)  03/25/2016   Past Medical History:  Diagnosis Date   COVID-19 12/2020   Diabetes mellitus without complication (HCC)    Hypertension    Migraine    No past surgical history on file. Family History  Adopted: Yes    Current Outpatient Medications:    buPROPion (WELLBUTRIN) 100 MG tablet, Take 1 tablet (100 mg total) by mouth 2 (two) times daily., Disp: 120 tablet, Rfl: 0   Continuous Blood Gluc Receiver (DEXCOM G7 RECEIVER) DEVI, Check continuous blood glucose daily., Disp: 1 each, Rfl: 0   Continuous Glucose Sensor (DEXCOM G7 SENSOR) MISC, Check continuous blood glucose daily., Disp: 9 each, Rfl: 3   Dulaglutide (TRULICITY) 3 MG/0.5ML SOPN, Inject 3 mg as directed once a week., Disp: 6 mL, Rfl: 2   insulin glargine, 1 Unit Dial, (TOUJEO SOLOSTAR) 300 UNIT/ML Solostar Pen, Inject 15 units into skin once daily. Increase by 2 units every 2 days for fasting blood sugars above 150. Max daily dose 50 units., Disp: , Rfl:    Insulin Pen Needle (BD PEN NEEDLE NANO U/F) 32G X 4 MM MISC, Use to inject insulin daily, Disp: 100 each, Rfl: 1   lisinopril-hydrochlorothiazide (ZESTORETIC) 20-12.5 MG tablet, Take 1 tablet by mouth daily., Disp: 60 tablet, Rfl: 0   metFORMIN (GLUCOPHAGE XR) 500 MG 24 hr tablet, Take 2 tablets (1,000 mg total) by mouth daily with breakfast., Disp: 180 tablet, Rfl: 1   omeprazole (PRILOSEC) 10 MG capsule, Take 10 mg by mouth daily., Disp: , Rfl:  rosuvastatin (CRESTOR) 10 MG tablet, Take 1 tablet (10 mg total) by mouth daily., Disp: 90 tablet, Rfl: 3 Allergies  Allergen Reactions   Atorvastatin Nausea And Vomiting and Other (See Comments)    Severe GI upset   Other Rash    Cinnamon    Misc. Sulfonamide Containing Compounds Rash   Sulfa Antibiotics     Childhood allergy     ROS: A complete ROS was performed with pertinent positives/negatives noted in the HPI. The remainder of the ROS are negative.    Objective:   There were no vitals filed for this  visit.  GENERAL: Well-appearing, in NAD. Well nourished.  SKIN: Pink, warm and dry. No rash, lesion, ulceration, or ecchymoses.  NECK: Trachea midline. Full ROM w/o pain or tenderness. No lymphadenopathy.  RESPIRATORY: Chest wall symmetrical. Respirations even and non-labored. Breath sounds clear to auscultation bilaterally.  CARDIAC: S1, S2 present, regular rate and rhythm. Peripheral pulses 2+ bilaterally.  MSK: Muscle tone and strength appropriate for age. Joints w/o tenderness, redness, or swelling. EXTREMITIES: Without clubbing, cyanosis, or edema.  NEUROLOGIC: No motor or sensory deficits. Steady, even gait.  PSYCH/MENTAL STATUS: Alert, oriented x 3. Cooperative, appropriate mood and affect.    No results found for any visits on 07/23/23.    Assessment & Plan:   There are no diagnoses linked to this encounter. No orders of the defined types were placed in this encounter.  No orders of the defined types were placed in this encounter.  Lab Orders  No laboratory test(s) ordered today   No images are attached to the encounter or orders placed in the encounter.  No follow-ups on file.   Salvatore Decent, FNP

## 2023-07-23 ENCOUNTER — Ambulatory Visit: Payer: 59 | Admitting: Internal Medicine

## 2023-07-23 VITALS — BP 160/100 | HR 73 | Temp 98.5°F | Ht 71.0 in | Wt 212.8 lb

## 2023-07-23 DIAGNOSIS — F320 Major depressive disorder, single episode, mild: Secondary | ICD-10-CM

## 2023-07-23 DIAGNOSIS — I10 Essential (primary) hypertension: Secondary | ICD-10-CM | POA: Diagnosis not present

## 2023-07-23 DIAGNOSIS — Z794 Long term (current) use of insulin: Secondary | ICD-10-CM

## 2023-07-23 DIAGNOSIS — F411 Generalized anxiety disorder: Secondary | ICD-10-CM

## 2023-07-23 DIAGNOSIS — E119 Type 2 diabetes mellitus without complications: Secondary | ICD-10-CM

## 2023-07-23 MED ORDER — TOUJEO SOLOSTAR 300 UNIT/ML ~~LOC~~ SOPN
25.0000 [IU] | PEN_INJECTOR | Freq: Every day | SUBCUTANEOUS | 3 refills | Status: DC
Start: 2023-07-23 — End: 2023-07-23

## 2023-07-23 MED ORDER — TOUJEO SOLOSTAR 300 UNIT/ML ~~LOC~~ SOPN
25.0000 [IU] | PEN_INJECTOR | Freq: Every day | SUBCUTANEOUS | 3 refills | Status: DC
Start: 2023-07-23 — End: 2024-01-01

## 2023-07-23 MED ORDER — LISINOPRIL-HYDROCHLOROTHIAZIDE 20-25 MG PO TABS
1.0000 | ORAL_TABLET | Freq: Every day | ORAL | 0 refills | Status: DC
Start: 2023-07-23 — End: 2024-01-21

## 2023-07-23 MED ORDER — BUPROPION HCL 100 MG PO TABS
100.0000 mg | ORAL_TABLET | Freq: Two times a day (BID) | ORAL | 1 refills | Status: DC
Start: 2023-07-23 — End: 2024-07-04

## 2023-07-24 LAB — TSH: TSH: 2.2 u[IU]/mL (ref 0.35–5.50)

## 2023-08-20 ENCOUNTER — Ambulatory Visit: Payer: 59 | Admitting: Internal Medicine

## 2023-08-24 NOTE — Telephone Encounter (Signed)
Error

## 2023-08-28 ENCOUNTER — Other Ambulatory Visit: Payer: Self-pay | Admitting: Family Medicine

## 2023-08-28 DIAGNOSIS — E119 Type 2 diabetes mellitus without complications: Secondary | ICD-10-CM

## 2023-09-02 ENCOUNTER — Ambulatory Visit: Payer: 59 | Admitting: Internal Medicine

## 2023-09-07 ENCOUNTER — Ambulatory Visit: Payer: 59 | Admitting: Internal Medicine

## 2023-09-25 ENCOUNTER — Other Ambulatory Visit: Payer: Self-pay | Admitting: Family Medicine

## 2023-10-28 ENCOUNTER — Other Ambulatory Visit: Payer: Self-pay | Admitting: Family Medicine

## 2023-10-28 DIAGNOSIS — E119 Type 2 diabetes mellitus without complications: Secondary | ICD-10-CM

## 2023-12-08 ENCOUNTER — Encounter: Payer: Self-pay | Admitting: Internal Medicine

## 2023-12-13 ENCOUNTER — Telehealth: Payer: 59 | Admitting: Family

## 2023-12-13 DIAGNOSIS — R6889 Other general symptoms and signs: Secondary | ICD-10-CM

## 2023-12-13 MED ORDER — OSELTAMIVIR PHOSPHATE 75 MG PO CAPS: 75.0000 mg | ORAL_CAPSULE | Freq: Two times a day (BID) | ORAL | 0 refills | Status: DC

## 2023-12-13 MED ORDER — BENZONATATE 100 MG PO CAPS: 100.0000 mg | ORAL_CAPSULE | Freq: Three times a day (TID) | ORAL | 0 refills | Status: DC | PRN

## 2023-12-13 NOTE — Progress Notes (Signed)
 E visit for Flu like symptoms   We are sorry that you are not feeling well.  Here is how we plan to help! Based on what you have shared with me it looks like you may have a respiratory virus that may be influenza.  Influenza or "the flu" is   an infection caused by a respiratory virus. The flu virus is highly contagious and persons who did not receive their yearly flu vaccination may "catch" the flu from close contact.  We have anti-viral medications to treat the viruses that cause this infection. They are not a "cure" and only shorten the course of the infection. These prescriptions are most effective when they are given within the first 2 days of "flu" symptoms. Antiviral medication are indicated if you have a high risk of complications from the flu. You should  also consider an antiviral medication if you are in close contact with someone who is at risk. These medications can help patients avoid complications from the flu  but have side effects that you should know. Possible side effects from Tamiflu  or oseltamivir  include nausea, vomiting, diarrhea, dizziness, headaches, eye redness, sleep problems or other respiratory symptoms. You should not take Tamiflu  if you have an allergy to oseltamivir  or any to the ingredients in Tamiflu .  Based upon your symptoms and potential risk factors I have prescribed Oseltamivir  (Tamiflu ).  It has been sent to your designated pharmacy.  You will take one 75 mg capsule orally twice a day for the next 5 days. I have sent in tessalon  you can take three times a day for a cough.   ANYONE WHO HAS FLU SYMPTOMS SHOULD: Stay home. The flu is highly contagious and going out or to work exposes others! Be sure to drink plenty of fluids. Water is fine as well as fruit juices, sodas and electrolyte beverages. You may want to stay away from caffeine or alcohol. If you are nauseated, try taking small sips of liquids. How do you know if you are getting enough fluid? Your urine  should be a pale yellow or almost colorless. Get rest. Taking a steamy shower or using a humidifier may help nasal congestion and ease sore throat pain. Using a saline nasal spray works much the same way. Cough drops, hard candies and sore throat lozenges may ease your cough. Line up a caregiver. Have someone check on you regularly.   GET HELP RIGHT AWAY IF: You cannot keep down liquids or your medications. You become short of breath Your fell like you are going to pass out or loose consciousness. Your symptoms persist after you have completed your treatment plan MAKE SURE YOU  Understand these instructions. Will watch your condition. Will get help right away if you are not doing well or get worse.  Your e-visit answers were reviewed by a board certified advanced clinical practitioner to complete your personal care plan.  Depending on the condition, your plan could have included both over the counter or prescription medications.  If there is a problem please reply  once you have received a response from your provider.  Your safety is important to us .  If you have drug allergies check your prescription carefully.    You can use MyChart to ask questions about today's visit, request a non-urgent call back, or ask for a work or school excuse for 24 hours related to this e-Visit. If it has been greater than 24 hours you will need to follow up with your provider, or enter  a new e-Visit to address those concerns.  You will get an e-mail in the next two days asking about your experience.  I hope that your e-visit has been valuable and will speed your recovery. Thank you for using e-visits.  Approximately 5 minutes was spent documenting and reviewing patient's chart.

## 2023-12-23 ENCOUNTER — Ambulatory Visit: Payer: 59 | Admitting: Internal Medicine

## 2023-12-25 ENCOUNTER — Ambulatory Visit: Payer: 59 | Admitting: Internal Medicine

## 2023-12-31 NOTE — Progress Notes (Signed)
[facility]ARE [facility] [address] [place] [address] Dept: [phone] Dept Fax: [phone]  Acute Care Office Visit  Subjective:   Dale Scott 07-Mar-1981 01/01/2024  Chief Complaint  Patient presents with   Follow-up    Discuss B/p     HPI: Discussed the use of AI scribe software for clinical note transcription with the patient, who gave verbal consent to proceed.  History of Present Illness   The patient, with a history of hypertension and diabetes, presents with persistently elevated blood pressure despite medication. He reports an incident a month and a half ago where his blood pressure dropped to 60/40, leading to a fainting episode. This event prompted him to stop all medications out of fear, but he has since resumed his regimen for the past two to three weeks. Despite this, his blood pressure remains high, with systolic readings around 140 and diastolic readings in the 100s.  In addition to his hypertension, the patient's blood sugar control has been suboptimal. Despite taking Trulicity 3mg  once weekly and metformin 1000mg  BID, and Toujeo 25 units daily, his fasting blood sugars have been around 250. He was recently sick with the flu 2-3 weeks ago.         The following portions of the patient's history were reviewed and updated as appropriate: past medical history, past surgical history, family history, social history, allergies, medications, and problem list.   Patient Active Problem List   Diagnosis Date Noted   Current mild episode of major depressive disorder without prior episode (HCC) 06/24/2023   Generalized anxiety disorder 06/24/2023   Colitis 12/15/2022   Essential hypertension 05/29/2021   Umbilical hernia 05/29/2021   Type 2 diabetes mellitus without complication (HCC) 03/25/2016   Past Medical History:  Diagnosis Date   COVID-19 12/2020   Diabetes mellitus without complication (HCC)     Hypertension    Migraine    History reviewed. No pertinent surgical history. Family History  Adopted: Yes    Current Outpatient Medications:    amLODipine (NORVASC) 5 MG tablet, Take 1 tablet (5 mg total) by mouth daily., Disp: 30 tablet, Rfl: 1   buPROPion (WELLBUTRIN) 100 MG tablet, Take 1 tablet (100 mg total) by mouth 2 (two) times daily., Disp: 180 tablet, Rfl: 1   Continuous Blood Gluc Receiver (DEXCOM G7 RECEIVER) DEVI, Check continuous blood glucose daily., Disp: 1 each, Rfl: 0   Dulaglutide 4.5 MG/0.5ML SOAJ, Inject 4 mg into the skin once a week., Disp: 6 mL, Rfl: 3   lisinopril-hydrochlorothiazide (ZESTORETIC) 20-25 MG tablet, Take 1 tablet by mouth daily., Disp: 60 tablet, Rfl: 0   metFORMIN (GLUCOPHAGE XR) 500 MG 24 hr tablet, Take 2 tablets (1,000 mg total) by mouth daily with breakfast., Disp: 180 tablet, Rfl: 1   omeprazole (PRILOSEC) 10 MG capsule, Take 10 mg by mouth daily., Disp: , Rfl:    rosuvastatin (CRESTOR) 10 MG tablet, Take 1 tablet (10 mg total) by mouth daily., Disp: 90 tablet, Rfl: 3   Continuous Glucose Sensor (DEXCOM G7 SENSOR) MISC, Check continuous blood glucose daily., Disp: 9 each, Rfl: 3   insulin glargine, 1 Unit Dial, (TOUJEO SOLOSTAR) 300 UNIT/ML Solostar Pen, Inject 27 Units into the skin daily. Increase by 2 units every 2-3 days for fasting sugars above 200. Max 50 units., Disp: , Rfl:    Insulin Pen Needle (BD PEN NEEDLE NANO U/F) 32G X 4 MM MISC, Use to inject insulin daily, Disp: 100 each, Rfl: 1 Allergies  Allergen Reactions   Atorvastatin Nausea And Vomiting and Other (See Comments)    Severe GI upset   Other Rash    Cinnamon    Misc. Sulfonamide Containing Compounds Rash   Sulfa Antibiotics     Childhood allergy     ROS: A complete ROS was performed with pertinent positives/negatives noted in the HPI. The remainder of the ROS are negative.    Objective:   Today's Vitals   01/01/24 0804 01/01/24 0827  BP: (!) 160/108 (!) 180/100   Pulse: 81   Temp: 98.4 F (36.9 C)   TempSrc: Temporal   SpO2: 99%   Weight: 212 lb (96.2 kg)   Height: 5\' 11"  (1.803 m)     GENERAL: Well-appearing, in NAD. Well nourished.  SKIN: Pink, warm and dry. No rash, lesion, ulceration, or ecchymoses.  NECK: Trachea midline. Full ROM w/o pain or tenderness. No lymphadenopathy.  RESPIRATORY: Chest wall symmetrical. Respirations even and non-labored. Breath sounds clear to auscultation bilaterally.  CARDIAC: S1, S2 present, regular rate and rhythm. Peripheral pulses 2+ bilaterally.  EXTREMITIES: Without clubbing, cyanosis, or edema.  NEUROLOGIC: No motor or sensory deficits. Steady, even gait.  PSYCH/MENTAL STATUS: Alert, oriented x 3. Cooperative, appropriate mood and affect.    Results for orders placed or performed in visit on 01/01/24  POCT glycosylated hemoglobin (Hb A1C)  Result Value Ref Range   Hemoglobin A1C 9.6 (A) 4.0 - 5.6 %   HbA1c POC (<> result, manual entry)     HbA1c, POC (prediabetic range)     HbA1c, POC (controlled diabetic range)        Assessment & Plan:  Assessment and Plan    Hypertension Persistent elevated blood pressure despite medication. Recent episode of hypotension leading to syncope and temporary discontinuation of antihypertensives. -Add Amlodipine 5mg  daily to current regimen of Lisinopril-Hydrochlorothiazide 20-25mg  daily. -Check blood pressure in 3-4 weeks.  Type 2 Diabetes Mellitus Elevated fasting blood sugars and A1c of 9.6% despite current regimen of Trulicity and Toujeo insulin. -Increase Trulicity to 4.5mg  once weekly. -Increase Toujeo insulin to 27 units daily, with instructions to increase by 2 units every 2-3 days if fasting blood sugars remain above 200, up to a maximum of 50 units daily.  General Health Maintenance -Continue monitoring blood sugars at home. -Schedule eye exam. -Consider Tetanus vaccine in the near future.       Meds ordered this encounter  Medications    amLODipine (NORVASC) 5 MG tablet    Sig: Take 1 tablet (5 mg total) by mouth daily.    Dispense:  30 tablet    Refill:  1    Supervising Provider:   Garnette Gunner [[id number]]   Dulaglutide 4.5 MG/0.5ML SOAJ    Sig: Inject 4 mg into the skin once a week.    Dispense:  6 mL    Refill:  3    Supervising Provider:   Garnette Gunner [[id number]]   insulin glargine, 1 Unit Dial, (TOUJEO SOLOSTAR) 300 UNIT/ML Solostar Pen    Sig: Inject 27 Units into the skin daily. Increase by 2 units every 2-3 days for fasting sugars above 200. Max 50 units.    Brand name only per patient insurance.    Supervising Provider:   Garnette Gunner [[id number]]   Orders Placed This Encounter  Procedures   Comp Met (CMET)   POCT glycosylated hemoglobin (Hb A1C)   Lab Orders         Comp Met (CMET)  POCT glycosylated hemoglobin (Hb A1C)     No images are attached to the encounter or orders placed in the encounter.  Return in about 3 weeks (around 01/22/2024) for Blood Pressure re-check.   Salvatore Decent, FNP

## 2024-01-01 ENCOUNTER — Encounter: Payer: Self-pay | Admitting: Internal Medicine

## 2024-01-01 ENCOUNTER — Ambulatory Visit: Payer: 59 | Admitting: Internal Medicine

## 2024-01-01 VITALS — BP 180/100 | HR 81 | Temp 98.4°F | Ht 71.0 in | Wt 212.0 lb

## 2024-01-01 DIAGNOSIS — F411 Generalized anxiety disorder: Secondary | ICD-10-CM

## 2024-01-01 DIAGNOSIS — Z7985 Long-term (current) use of injectable non-insulin antidiabetic drugs: Secondary | ICD-10-CM

## 2024-01-01 DIAGNOSIS — E119 Type 2 diabetes mellitus without complications: Secondary | ICD-10-CM

## 2024-01-01 DIAGNOSIS — I10 Essential (primary) hypertension: Secondary | ICD-10-CM

## 2024-01-01 DIAGNOSIS — E1169 Type 2 diabetes mellitus with other specified complication: Secondary | ICD-10-CM

## 2024-01-01 DIAGNOSIS — F320 Major depressive disorder, single episode, mild: Secondary | ICD-10-CM

## 2024-01-01 LAB — COMPREHENSIVE METABOLIC PANEL
ALT: 21 U/L (ref 0–53)
AST: 17 U/L (ref 0–37)
Albumin: 4.4 g/dL (ref 3.5–5.2)
Alkaline Phosphatase: 54 U/L (ref 39–117)
BUN: 16 mg/dL (ref 6–23)
CO2: 28 meq/L (ref 19–32)
Calcium: 9.1 mg/dL (ref 8.4–10.5)
Chloride: 102 meq/L (ref 96–112)
Creatinine, Ser: 0.89 mg/dL (ref 0.40–1.50)
GFR: 105.16 mL/min (ref >60.00)
Glucose, Bld: 163 mg/dL — ABNORMAL HIGH (ref 70–99)
Potassium: 4 meq/L (ref 3.5–5.1)
Sodium: 137 meq/L (ref 135–145)
Total Bilirubin: 0.6 mg/dL (ref 0.2–1.2)
Total Protein: 7 g/dL (ref 6.0–8.3)

## 2024-01-01 LAB — POCT GLYCOSYLATED HEMOGLOBIN (HGB A1C): Hemoglobin A1C: 9.6 % — AB (ref 4.0–5.6)

## 2024-01-01 MED ORDER — TOUJEO SOLOSTAR 300 UNIT/ML ~~LOC~~ SOPN: 27.0000 [IU] | PEN_INJECTOR | Freq: Every day | SUBCUTANEOUS | Status: DC

## 2024-01-01 MED ORDER — AMLODIPINE BESYLATE 5 MG PO TABS
5.0000 mg | ORAL_TABLET | Freq: Every day | ORAL | 1 refills | Status: DC
Start: 2024-01-01 — End: 2024-01-21

## 2024-01-01 MED ORDER — DULAGLUTIDE 4.5 MG/0.5ML ~~LOC~~ SOAJ
4.0000 mg | SUBCUTANEOUS | 3 refills | Status: AC
Start: 2024-01-01 — End: ?

## 2024-01-14 ENCOUNTER — Other Ambulatory Visit: Payer: Self-pay | Admitting: Internal Medicine

## 2024-01-14 MED ORDER — BD PEN NEEDLE NANO U/F 32G X 4 MM MISC: 1 refills | Status: AC

## 2024-01-14 NOTE — Telephone Encounter (Signed)
Copied from CRM [id number]. Topic: Clinical - Medication Refill >> Jan 14, 2024 10:14 AM Fredrich Romans wrote: Most Recent Primary Care Visit:  Provider: Salvatore Decent  Department: [facility]  Visit Type: OFFICE VISIT  Date: 01/01/2024  Medication: Insulin Pen Needle (BD PEN NEEDLE NANO U/F) 32G X 4 MM MISC  Has the patient contacted their pharmacy? No (Agent: If no, request that the patient contact the pharmacy for the refill. If patient does not wish to contact the pharmacy document the reason why and proceed with request.) (Agent: If yes, when and what did the pharmacy advise?)  Is this the correct pharmacy for this prescription? Yes If no, delete pharmacy and type the correct one.  This is the patient's preferred pharmacy:    [facility] [address] - [place], [place] - [address], [address] [place] [address] Phone: [phone] Fax: [phone]   Has the prescription been filled recently? Yes  Is the patient out of the medication? Yes  Has the patient been seen for an appointment in the last year OR does the patient have an upcoming appointment? Yes  Can we respond through MyChart? Yes  Agent: Please be advised that Rx refills may take up to 3 business days. We ask that you follow-up with your pharmacy.

## 2024-01-20 NOTE — Progress Notes (Deleted)
 [facility]ARE [facility] [address] [place] [address] Dept: [phone] Dept Fax: [phone]    Subjective:   Dale Scott [birth year] 01/21/2024  No chief complaint on file.   HPI: ISAIAS DOWSON presents today for re-assessment and management of chronic medical conditions.  Discussed the use of AI scribe software for clinical note transcription with the patient, who gave verbal consent to proceed.  History of Present Illness               The following portions of the patient's history were reviewed and updated as appropriate: past medical history, past surgical history, family history, social history, allergies, medications, and problem list.   Patient Active Problem List   Diagnosis Date Noted   Current mild episode of major depressive disorder without prior episode (HCC) 06/24/2023   Generalized anxiety disorder 06/24/2023   Colitis 12/15/2022   Essential hypertension 05/29/2021   Umbilical hernia 05/29/2021   Type 2 diabetes mellitus without complication (HCC) 03/25/2016   Past Medical History:  Diagnosis Date   COVID-19 12/2020   Diabetes mellitus without complication (HCC)    Hypertension    Migraine    No past surgical history on file. Family History  Adopted: Yes    Current Outpatient Medications:    amLODipine (NORVASC) 5 MG tablet, Take 1 tablet (5 mg total) by mouth daily., Disp: 30 tablet, Rfl: 1   buPROPion (WELLBUTRIN) 100 MG tablet, Take 1 tablet (100 mg total) by mouth 2 (two) times daily., Disp: 180 tablet, Rfl: 1   Continuous Blood Gluc Receiver (DEXCOM G7 RECEIVER) DEVI, Check continuous blood glucose daily., Disp: 1 each, Rfl: 0   Continuous Glucose Sensor (DEXCOM G7 SENSOR) MISC, Check continuous blood glucose daily., Disp: 9 each, Rfl: 3   Dulaglutide 4.5 MG/0.5ML SOAJ, Inject 4 mg into the skin once a week., Disp: 6 mL, Rfl: 3   insulin glargine, 1 Unit Dial, (TOUJEO SOLOSTAR) 300  UNIT/ML Solostar Pen, Inject 27 Units into the skin daily. Increase by 2 units every 2-3 days for fasting sugars above 200. Max 50 units., Disp: , Rfl:    Insulin Pen Needle (BD PEN NEEDLE NANO U/F) 32G X 4 MM MISC, Use to inject insulin daily, Disp: 100 each, Rfl: 1   lisinopril-hydrochlorothiazide (ZESTORETIC) 20-25 MG tablet, Take 1 tablet by mouth daily., Disp: 60 tablet, Rfl: 0   metFORMIN (GLUCOPHAGE XR) 500 MG 24 hr tablet, Take 2 tablets (1,000 mg total) by mouth daily with breakfast., Disp: 180 tablet, Rfl: 1   omeprazole (PRILOSEC) 10 MG capsule, Take 10 mg by mouth daily., Disp: , Rfl:    rosuvastatin (CRESTOR) 10 MG tablet, Take 1 tablet (10 mg total) by mouth daily., Disp: 90 tablet, Rfl: 3 Allergies  Allergen Reactions   Atorvastatin Nausea And Vomiting and Other (See Comments)    Severe GI upset   Other Rash    Cinnamon    Misc. Sulfonamide Containing Compounds Rash   Sulfa Antibiotics     Childhood allergy     ROS: A complete ROS was performed with pertinent positives/negatives noted in the HPI. The remainder of the ROS are negative.    Objective:   There were no vitals filed for this visit.  GENERAL: Well-appearing, in NAD. Well nourished.  SKIN: Pink, warm and dry. No rash, lesion, ulceration, or ecchymoses.  NECK: Trachea midline. Full ROM w/o pain or tenderness. No lymphadenopathy.  RESPIRATORY: Chest wall symmetrical. Respirations even and non-labored. Breath sounds  clear to auscultation bilaterally.  CARDIAC: S1, S2 present, regular rate and rhythm. Peripheral pulses 2+ bilaterally.  EXTREMITIES: Without clubbing, cyanosis, or edema.  NEUROLOGIC: No motor or sensory deficits. Steady, even gait.  PSYCH/MENTAL STATUS: Alert, oriented x 3. Cooperative, appropriate mood and affect.   Health Maintenance Due  Topic Date Due   OPHTHALMOLOGY EXAM  Never done   Pneumococcal Vaccine 46-79 Years old (2 of 2 - PCV) 05/22/2021   COVID-19 Vaccine (4 - 2024-25 season)  08/02/2023    No results found for any visits on 01/21/24.  The 10-year ASCVD risk score (Arnett DK, et al., 2019) is: 8.2%     Assessment & Plan:  Assessment and Plan              There are no diagnoses linked to this encounter. No orders of the defined types were placed in this encounter.  No images are attached to the encounter or orders placed in the encounter. No orders of the defined types were placed in this encounter.   No follow-ups on file.   Salvatore Decent, FNP

## 2024-01-21 ENCOUNTER — Other Ambulatory Visit: Payer: Self-pay

## 2024-01-21 ENCOUNTER — Ambulatory Visit: Payer: 59 | Admitting: Internal Medicine

## 2024-01-21 ENCOUNTER — Encounter: Payer: Self-pay | Admitting: Internal Medicine

## 2024-01-21 ENCOUNTER — Other Ambulatory Visit: Payer: Self-pay | Admitting: Internal Medicine

## 2024-01-21 ENCOUNTER — Telehealth: Payer: 59 | Admitting: Internal Medicine

## 2024-01-21 VITALS — BP 126/80 | HR 70

## 2024-01-21 DIAGNOSIS — Z23 Encounter for immunization: Secondary | ICD-10-CM

## 2024-01-21 DIAGNOSIS — I10 Essential (primary) hypertension: Secondary | ICD-10-CM

## 2024-01-21 DIAGNOSIS — E119 Type 2 diabetes mellitus without complications: Secondary | ICD-10-CM

## 2024-01-21 DIAGNOSIS — Z794 Long term (current) use of insulin: Secondary | ICD-10-CM | POA: Diagnosis not present

## 2024-01-21 MED ORDER — METFORMIN HCL ER 500 MG PO TB24: 1000.0000 mg | ORAL_TABLET | Freq: Every day | ORAL | 1 refills | Status: DC

## 2024-01-21 MED ORDER — ROSUVASTATIN CALCIUM 10 MG PO TABS: 10.0000 mg | ORAL_TABLET | Freq: Every day | ORAL | 3 refills | Status: DC

## 2024-01-21 MED ORDER — AMLODIPINE BESYLATE 5 MG PO TABS: 5.0000 mg | ORAL_TABLET | Freq: Every day | ORAL | 3 refills | Status: DC

## 2024-01-21 NOTE — Progress Notes (Signed)
[facility] [address] [place] [address] Dept: [phone] Dept Fax: [phone]  Virtual Video Visit  I connected with Dale Scott on 01/21/24 at  1:00 PM EST by a video enabled telemedicine application and verified that I am speaking with the correct person using two identifiers.   Location patient: Home Location provider: Clinic Total time: 4 minutes Persons participating in the virtual visit: Patient; Mary Sella CMA; Salvatore Decent, FNP-C  I discussed the limitations of evaluation and management by telemedicine and the availability of in-person appointments. The patient expressed understanding and agreed to proceed.  Chief Complaint  Patient presents with   Follow-up    B/p    SUBJECTIVE:  HPI:  HYPERTENSION: Dale Scott presents for the medical management of hypertension.  Patient's current hypertension medication regimen is: Lisinopril-hydrochlorothiazide 20-25mg , Amlodipine 5mg  Patient is  currently taking prescribed medications for HTN.  Patient is  regularly keeping a check on BP at home.  BP is doing much better after adding amlodipine at last visit Denies headache, dizziness, CP, SHOB, vision changes.   BP Readings from Last 3 Encounters:  01/21/24 126/80  01/01/24 (!) 180/100  07/23/23 (!) 160/100   DIABETES MELLITUS: Dale Scott presents for the medical management of diabetes.  Current diabetes medication regimen: Trulicity 4.5mg  weekly, Metformin 1000mg  BID, Toujeo 41 units  Patient is  adhering to a diabetic diet.  Patient is  checking BS regularly. Avg: 120-130's BS improved with increasing trulicity and toujeo from last office visit Lab Results  Component Value Date   HGBA1C 9.6 (A) 01/01/2024    08/20/2022 Lab Results  Component Value Date   MICROALBUR <0.7 07/22/2023   MICROALBUR 30 08/20/2022     The following portions of the patient's history were reviewed and updated  as appropriate: medical history, surgical history, medications, allergies, social history, and family history.    Past Medical History:  Diagnosis Date   COVID-19 12/2020   Diabetes mellitus without complication (HCC)    Hypertension    Migraine    No past surgical history on file.   Current Outpatient Medications:    amLODipine (NORVASC) 5 MG tablet, Take 1 tablet (5 mg total) by mouth daily., Disp: 90 tablet, Rfl: 3   buPROPion (WELLBUTRIN) 100 MG tablet, Take 1 tablet (100 mg total) by mouth 2 (two) times daily., Disp: 180 tablet, Rfl: 1   Continuous Blood Gluc Receiver (DEXCOM G7 RECEIVER) DEVI, Check continuous blood glucose daily., Disp: 1 each, Rfl: 0   Continuous Glucose Sensor (DEXCOM G7 SENSOR) MISC, Check continuous blood glucose daily., Disp: 9 each, Rfl: 3   Dulaglutide 4.5 MG/0.5ML SOAJ, Inject 4 mg into the skin once a week., Disp: 6 mL, Rfl: 3   insulin glargine, 1 Unit Dial, (TOUJEO SOLOSTAR) 300 UNIT/ML Solostar Pen, Inject 27 Units into the skin daily. Increase by 2 units every 2-3 days for fasting sugars above 200. Max 50 units., Disp: , Rfl:    Insulin Pen Needle (BD PEN NEEDLE NANO U/F) 32G X 4 MM MISC, Use to inject insulin daily, Disp: 100 each, Rfl: 1   lisinopril-hydrochlorothiazide (ZESTORETIC) 20-25 MG tablet, Take 1 tablet by mouth daily., Disp: 60 tablet, Rfl: 0   metFORMIN (GLUCOPHAGE XR) 500 MG 24 hr tablet, Take 2 tablets (1,000 mg total) by mouth daily with breakfast., Disp: 180 tablet, Rfl: 1   omeprazole (PRILOSEC) 10 MG capsule, Take 10 mg by mouth daily., Disp: , Rfl:  rosuvastatin (CRESTOR) 10 MG tablet, Take 1 tablet (10 mg total) by mouth daily., Disp: 90 tablet, Rfl: 3 Allergies  Allergen Reactions   Atorvastatin Nausea And Vomiting and Other (See Comments)    Severe GI upset   Other Rash    Cinnamon    Misc. Sulfonamide Containing Compounds Rash   Sulfa Antibiotics     Childhood allergy    Social History   Socioeconomic History    Marital status: Married    Spouse name: Not on file   Number of children: Not on file   Years of education: Not on file   Highest education level: Associate degree: occupational, Scientist, product/process development, or vocational program  Occupational History   Not on file  Tobacco Use   Smoking status: Never   Smokeless tobacco: Never  Vaping Use   Vaping status: Never Used  Substance and Sexual Activity   Alcohol use: No   Drug use: No   Sexual activity: Not on file  Other Topics Concern   Not on file  Social History Narrative   Not on file   Social Drivers of Health   Financial Resource Strain: Low Risk  (01/01/2024)   Overall Financial Resource Strain (CARDIA)    Difficulty of Paying Living Expenses: Not very hard  Food Insecurity: Food Insecurity Present (01/01/2024)   Hunger Vital Sign    Worried About Running Out of Food in the Last Year: Never true    Ran Out of Food in the Last Year: Sometimes true  Transportation Needs: No Transportation Needs (01/01/2024)   PRAPARE - Administrator, Civil Service (Medical): No    Lack of Transportation (Non-Medical): No  Physical Activity: Inactive (01/01/2024)   Exercise Vital Sign    Days of Exercise per Week: 0 days    Minutes of Exercise per Session: 60 min  Stress: Stress Concern Present (01/01/2024)   [employer] of Occupational Health - Occupational Stress Questionnaire    Feeling of Stress : Rather much  Social Connections: Moderately Integrated (01/01/2024)   Social Connection and Isolation Panel [NHANES]    Frequency of Communication with Friends and Family: More than three times a week    Frequency of Social Gatherings with Friends and Family: Once a week    Attends Religious Services: Never    Database administrator or Organizations: Yes    Attends Engineer, structural: More than 4 times per year    Marital Status: Married  Catering manager Violence: Unknown (03/05/2022)   Received from [employer], [facility]    HITS    Physically Hurt: Not on file    Insult or Talk Down To: Not on file    Threaten Physical Harm: Not on file    Scream or Curse: Not on file    Family History  Adopted: Yes     ROS: A complete ROS was performed with pertinent positives/negatives noted in the HPI. The remainder of the ROS are negative.    OBJECTIVE:  VITALS per patient if applicable: Today's Vitals   01/21/24 1257  BP: 126/80  Pulse: 70   There is no height or weight on file to calculate BMI.   GENERAL: Alert and oriented. Appears well and in no acute distress. HEENT: Atraumatic. Conjunctiva clear. No obvious abnormalities on inspection of external nose and ears. NECK: Normal movements of the head and neck. LUNGS: On inspection, no signs of respiratory distress. Breathing rate appears normal. No obvious gross SOB, gasping or wheezing,  and no conversational dyspnea. CV: No obvious cyanosis. MS: Moves all visible extremities without noticeable abnormality. PSYCH/NEURO: Pleasant and cooperative. No obvious depression or anxiety. Speech and thought processing grossly intact.  ASSESSMENT AND PLAN: 1. Essential hypertension (Primary) - stable  - amLODipine (NORVASC) 5 MG tablet; Take 1 tablet (5 mg total) by mouth daily.  Dispense: 90 tablet; Refill: 3 - continue with current medication regimen   2. Type 2 diabetes mellitus without complication, with long-term current use of insulin (HCC) - BS level much improved with increase of Toujeo and Trulicity  - continue current regimen    I discussed the assessment and treatment plan with the patient. The patient was provided an opportunity to ask questions and all were answered. The patient agreed with the plan and demonstrated an understanding of the instructions.   The patient was advised to call back or seek an in-person evaluation if the symptoms worsen or if the condition fails to improve as anticipated.  Return in about 3 months (around 04/19/2024) for  Chronic Condition follow up.  Salvatore Decent, FNP

## 2024-02-03 ENCOUNTER — Ambulatory Visit: Payer: Self-pay | Admitting: Internal Medicine

## 2024-02-03 NOTE — Telephone Encounter (Signed)
 Copied from CRM [id number]. Topic: Clinical - Prescription Issue >> Feb 03, 2024  8:34 AM Theodis Sato wrote: Reason for CRM:  Elnita Maxwell a pharmacist with [facility] is calling to get clarification on patients insulin glargine, 1 Unit Dial, (TOUJEO SOLOSTAR) 300 UNIT/ML Solostar Pen - Elnita Maxwell states that [facility] received all the patients medications from [facility] but states she has received 2 different prescriptions for the Toujeo one generic and one name brand. Elnita Maxwell does not know which one to fill and needs clarification on dosage. Please call [facility] pharmacy at [phone]   Chief Complaint: Medication Question  Disposition: [] ED /[] Urgent Care (no appt availability in office) / [] Appointment(In office/virtual)/ []  [facility]/ [] Home Care/ [] Refused Recommended Disposition /[] [facility]/ [x]  Follow-up with PCP Additional Notes: Contacted Chemical engineer at [phone].   Spoke with Ermalene Searing, [employer] and clarified information based off of provider's written order In the chart.   Reason for Disposition  Pharmacy calling with prescription question and triager answers question  Protocols used: Medication Question Call-A-AH

## 2024-02-03 NOTE — Telephone Encounter (Signed)
 Spoke to [employer] and answered all questions are concerns.

## 2024-03-16 ENCOUNTER — Telehealth: Admitting: Physician Assistant

## 2024-03-16 DIAGNOSIS — J208 Acute bronchitis due to other specified organisms: Secondary | ICD-10-CM | POA: Diagnosis not present

## 2024-03-16 DIAGNOSIS — B9689 Other specified bacterial agents as the cause of diseases classified elsewhere: Secondary | ICD-10-CM | POA: Diagnosis not present

## 2024-03-16 MED ORDER — BENZONATATE 100 MG PO CAPS: 100.0000 mg | ORAL_CAPSULE | Freq: Three times a day (TID) | ORAL | 0 refills | Status: AC | PRN

## 2024-03-16 MED ORDER — DOXYCYCLINE HYCLATE 100 MG PO TABS: 100.0000 mg | ORAL_TABLET | Freq: Two times a day (BID) | ORAL | 0 refills | Status: DC

## 2024-03-16 MED ORDER — DOXYCYCLINE HYCLATE 100 MG PO TABS: 100.0000 mg | ORAL_TABLET | Freq: Two times a day (BID) | ORAL | 0 refills | Status: AC

## 2024-03-16 MED ORDER — BENZONATATE 100 MG PO CAPS: 100.0000 mg | ORAL_CAPSULE | Freq: Three times a day (TID) | ORAL | 0 refills | Status: DC | PRN

## 2024-03-16 NOTE — Progress Notes (Signed)
 I have spent 5 minutes in review of e-visit questionnaire, review and updating patient chart, medical decision making and response to patient.   Piedad Climes, PA-C

## 2024-03-16 NOTE — Progress Notes (Signed)

## 2024-03-16 NOTE — Addendum Note (Signed)
 Addended by: Farris Hong on: 03/16/2024 07:25 PM   Modules accepted: Orders

## 2024-04-02 ENCOUNTER — Telehealth: Admitting: Nurse Practitioner

## 2024-04-02 DIAGNOSIS — H6501 Acute serous otitis media, right ear: Secondary | ICD-10-CM | POA: Diagnosis not present

## 2024-04-02 MED ORDER — CIPROFLOXACIN-DEXAMETHASONE 0.3-0.1 % OT SUSP
4.0000 [drp] | Freq: Two times a day (BID) | OTIC | 0 refills | Status: AC
Start: 2024-04-02 — End: ?

## 2024-04-02 NOTE — Progress Notes (Signed)
 I have spent 5 minutes in review of e-visit questionnaire, review and updating patient chart, medical decision making and response to patient.   Claiborne Rigg, NP

## 2024-04-02 NOTE — Progress Notes (Signed)

## 2024-04-05 ENCOUNTER — Encounter: Payer: Self-pay | Admitting: Internal Medicine

## 2024-04-05 DIAGNOSIS — E119 Type 2 diabetes mellitus without complications: Secondary | ICD-10-CM

## 2024-04-06 MED ORDER — TOUJEO SOLOSTAR 300 UNIT/ML ~~LOC~~ SOPN: 41.0000 [IU] | PEN_INJECTOR | Freq: Every day | SUBCUTANEOUS | 3 refills | Status: DC

## 2024-04-18 ENCOUNTER — Other Ambulatory Visit: Payer: Self-pay | Admitting: Internal Medicine

## 2024-04-18 DIAGNOSIS — I10 Essential (primary) hypertension: Secondary | ICD-10-CM

## 2024-07-04 ENCOUNTER — Other Ambulatory Visit: Payer: Self-pay

## 2024-07-04 DIAGNOSIS — F411 Generalized anxiety disorder: Secondary | ICD-10-CM

## 2024-07-04 DIAGNOSIS — F320 Major depressive disorder, single episode, mild: Secondary | ICD-10-CM

## 2024-07-04 MED ORDER — BUPROPION HCL 100 MG PO TABS: 100.0000 mg | ORAL_TABLET | Freq: Two times a day (BID) | ORAL | 1 refills | Status: AC

## 2024-07-19 ENCOUNTER — Other Ambulatory Visit: Payer: Self-pay | Admitting: Internal Medicine

## 2024-07-19 DIAGNOSIS — E119 Type 2 diabetes mellitus without complications: Secondary | ICD-10-CM

## 2024-08-17 NOTE — Telephone Encounter (Unsigned)
 Copied from CRM [id number]. Topic: Clinical - Prescription Issue >> Aug 15, 2024 11:59 AM Mercedes MATSU wrote: Reason for CRM: Patient recently had a rx of buPROPion  (WELLBUTRIN ) 100 MG tablet from a different doctor sent to a local [facility], [facility] pharmacy wanted to verify the dosage change. They are requesting a Call back, [phone] [place].

## 2024-10-19 ENCOUNTER — Other Ambulatory Visit: Payer: Self-pay | Admitting: Internal Medicine

## 2024-10-19 DIAGNOSIS — E119 Type 2 diabetes mellitus without complications: Secondary | ICD-10-CM

## 2024-10-19 NOTE — Telephone Encounter (Signed)
 Pt wanting refill on  metFORMIN  (GLUCOPHAGE -XR) 500 MG 24 hr tablet .  LOV 01/01/24 FOV not scheduled  LRF  07/20/24

## 2024-11-20 ENCOUNTER — Other Ambulatory Visit: Payer: Self-pay | Admitting: Internal Medicine

## 2024-11-20 DIAGNOSIS — I10 Essential (primary) hypertension: Secondary | ICD-10-CM

## 2024-12-03 ENCOUNTER — Other Ambulatory Visit: Payer: Self-pay | Admitting: Internal Medicine

## 2024-12-03 DIAGNOSIS — E119 Type 2 diabetes mellitus without complications: Secondary | ICD-10-CM

## 2024-12-16 ENCOUNTER — Other Ambulatory Visit: Payer: Self-pay | Admitting: Internal Medicine

## 2024-12-16 DIAGNOSIS — F320 Major depressive disorder, single episode, mild: Secondary | ICD-10-CM

## 2024-12-16 DIAGNOSIS — F411 Generalized anxiety disorder: Secondary | ICD-10-CM

## 2024-12-31 ENCOUNTER — Other Ambulatory Visit: Payer: Self-pay | Admitting: Internal Medicine
